# Patient Record
Sex: Male | Born: 1950 | Race: White | Hispanic: No | Marital: Married | Smoking: Current every day smoker
Health system: Southern US, Community
[De-identification: ages and names within clinical notes are randomized; demographics above are authoritative.]

## PROBLEM LIST (undated history)

## (undated) DIAGNOSIS — J449 Chronic obstructive pulmonary disease, unspecified: Secondary | ICD-10-CM

## (undated) DIAGNOSIS — G4700 Insomnia, unspecified: Secondary | ICD-10-CM

## (undated) DIAGNOSIS — K439 Ventral hernia without obstruction or gangrene: Secondary | ICD-10-CM

## (undated) DIAGNOSIS — E039 Hypothyroidism, unspecified: Secondary | ICD-10-CM

## (undated) DIAGNOSIS — E785 Hyperlipidemia, unspecified: Secondary | ICD-10-CM

## (undated) HISTORY — PX: HERNIA REPAIR: SHX51

## (undated) HISTORY — DX: Insomnia, unspecified: G47.00

## (undated) HISTORY — DX: Hypothyroidism, unspecified: E03.9

## (undated) HISTORY — DX: Hyperlipidemia, unspecified: E78.5

## (undated) HISTORY — PX: INGUINAL HERNIA REPAIR: SUR1180

## (undated) HISTORY — PX: SHOULDER SURGERY: SHX246

## (undated) HISTORY — PX: BACK SURGERY: SHX140

## (undated) HISTORY — DX: Ventral hernia without obstruction or gangrene: K43.9

## (undated) HISTORY — PX: APPENDECTOMY: SHX54

---

## 2004-07-15 ENCOUNTER — Emergency Department (HOSPITAL_COMMUNITY): Admission: EM | Admit: 2004-07-15 | Discharge: 2004-07-15 | Payer: Self-pay | Admitting: Emergency Medicine

## 2006-02-02 HISTORY — PX: CERVICAL DISC SURGERY: SHX588

## 2006-03-13 ENCOUNTER — Ambulatory Visit: Payer: Self-pay | Admitting: Orthopedic Surgery

## 2006-03-22 ENCOUNTER — Ambulatory Visit: Payer: Self-pay | Admitting: Orthopedic Surgery

## 2006-05-21 ENCOUNTER — Ambulatory Visit (HOSPITAL_COMMUNITY): Admission: RE | Admit: 2006-05-21 | Discharge: 2006-05-22 | Payer: Self-pay | Admitting: Neurosurgery

## 2007-12-20 ENCOUNTER — Ambulatory Visit: Payer: Self-pay | Admitting: Gastroenterology

## 2008-04-17 ENCOUNTER — Ambulatory Visit: Payer: Self-pay | Admitting: Family Medicine

## 2011-03-05 ENCOUNTER — Ambulatory Visit: Payer: Self-pay | Admitting: Surgery

## 2011-03-06 ENCOUNTER — Ambulatory Visit: Payer: Self-pay | Admitting: Surgery

## 2011-11-01 ENCOUNTER — Emergency Department: Payer: Self-pay | Admitting: Emergency Medicine

## 2013-03-03 ENCOUNTER — Ambulatory Visit: Payer: Self-pay | Admitting: Gastroenterology

## 2013-03-03 LAB — HM COLONOSCOPY

## 2013-03-07 LAB — PATHOLOGY REPORT

## 2013-06-23 ENCOUNTER — Ambulatory Visit: Payer: Self-pay | Admitting: Family Medicine

## 2013-09-30 ENCOUNTER — Observation Stay: Payer: Self-pay | Admitting: Internal Medicine

## 2013-09-30 LAB — CBC WITH DIFFERENTIAL/PLATELET
BASOS PCT: 0.6 %
Basophil #: 0.1 10*3/uL (ref 0.0–0.1)
EOS ABS: 0.3 10*3/uL (ref 0.0–0.7)
Eosinophil %: 3.4 %
HCT: 46.9 % (ref 40.0–52.0)
HGB: 15.4 g/dL (ref 13.0–18.0)
LYMPHS ABS: 2.5 10*3/uL (ref 1.0–3.6)
Lymphocyte %: 25.5 %
MCH: 31.4 pg (ref 26.0–34.0)
MCHC: 32.9 g/dL (ref 32.0–36.0)
MCV: 96 fL (ref 80–100)
Monocyte #: 0.6 x10 3/mm (ref 0.2–1.0)
Monocyte %: 6.3 %
NEUTROS ABS: 6.4 10*3/uL (ref 1.4–6.5)
NEUTROS PCT: 64.2 %
PLATELETS: 113 10*3/uL — AB (ref 150–440)
RBC: 4.9 10*6/uL (ref 4.40–5.90)
RDW: 14.1 % (ref 11.5–14.5)
WBC: 10 10*3/uL (ref 3.8–10.6)

## 2013-09-30 LAB — URINALYSIS, COMPLETE
BLOOD: NEGATIVE
Bacteria: NONE SEEN
Bilirubin,UR: NEGATIVE
Glucose,UR: NEGATIVE mg/dL (ref 0–75)
Hyaline Cast: 1
Ketone: NEGATIVE
Leukocyte Esterase: NEGATIVE
NITRITE: NEGATIVE
PH: 7 (ref 4.5–8.0)
Protein: NEGATIVE
RBC,UR: 1 /HPF (ref 0–5)
SPECIFIC GRAVITY: 1.008 (ref 1.003–1.030)
Squamous Epithelial: <1
WBC UR: 1 /HPF (ref 0–5)

## 2013-09-30 LAB — CK TOTAL AND CKMB (NOT AT ARMC)
CK, TOTAL: 69 U/L
CK, Total: 82 U/L
CK-MB: 1.1 ng/mL (ref 0.5–3.6)
CK-MB: 1.4 ng/mL (ref 0.5–3.6)

## 2013-09-30 LAB — COMPREHENSIVE METABOLIC PANEL
AST: 24 U/L (ref 15–37)
Albumin: 3.4 g/dL (ref 3.4–5.0)
Alkaline Phosphatase: 96 U/L
Anion Gap: 5 — ABNORMAL LOW (ref 7–16)
BILIRUBIN TOTAL: 0.3 mg/dL (ref 0.2–1.0)
BUN: 13 mg/dL (ref 7–18)
CALCIUM: 9 mg/dL (ref 8.5–10.1)
Chloride: 106 mmol/L (ref 98–107)
Co2: 27 mmol/L (ref 21–32)
Creatinine: 1.03 mg/dL (ref 0.60–1.30)
EGFR (African American): >60
GLUCOSE: 120 mg/dL — AB (ref 65–99)
OSMOLALITY: 277 (ref 275–301)
POTASSIUM: 3.7 mmol/L (ref 3.5–5.1)
SGPT (ALT): 20 U/L
Sodium: 138 mmol/L (ref 136–145)
Total Protein: 6.7 g/dL (ref 6.4–8.2)

## 2013-09-30 LAB — TROPONIN I
Troponin-I: <0.02 ng/mL
Troponin-I: <0.02 ng/mL
Troponin-I: <0.02 ng/mL

## 2013-09-30 LAB — TSH: THYROID STIMULATING HORM: 0.188 u[IU]/mL — AB

## 2014-01-16 LAB — BASIC METABOLIC PANEL
BUN: 11 mg/dL (ref 4–21)
Creatinine: 0.9 mg/dL (ref 0.6–1.3)
GLUCOSE: 114 mg/dL
Potassium: 4.4 mmol/L (ref 3.4–5.3)
Sodium: 142 mmol/L (ref 137–147)

## 2014-01-16 LAB — LIPID PANEL
Cholesterol: 153 mg/dL (ref 0–200)
HDL: 38 mg/dL (ref 35–70)
LDL Cholesterol: 94 mg/dL
Triglycerides: 103 mg/dL (ref 40–160)

## 2014-01-16 LAB — CBC AND DIFFERENTIAL
HCT: 45 % (ref 41–53)
Hemoglobin: 15 g/dL (ref 13.5–17.5)
NEUTROS ABS: 4 /uL
PLATELETS: 107 10*3/uL — AB (ref 150–399)
WBC: 6.4 10*3/mL

## 2014-01-16 LAB — HEPATIC FUNCTION PANEL
ALK PHOS: 93 U/L (ref 25–125)
ALT: 16 U/L (ref 10–40)
AST: 16 U/L (ref 14–40)
BILIRUBIN, TOTAL: 0.4 mg/dL

## 2014-03-21 LAB — PSA: PSA: 1.1

## 2014-03-21 LAB — TSH: TSH: 0.32 u[IU]/mL — AB (ref <=5.90)

## 2014-05-26 NOTE — Discharge Summary (Signed)
PATIENT NAME:  Hunter Oconnor, Hunter Oconnor MR#:  [id number] DATE OF BIRTH:  [birth year]  DATE OF ADMISSION:  09/30/2013  DATE OF DISCHARGE:  09/30/2013  DISCHARGE DIAGNOSES: 1.  Dizziness.  2.  Chronic obstructive pulmonary disease.  3.  Tobacco use.   DISCHARGE MEDICATIONS: 1.  Levothyroxine 200 mcg daily.  2.  Aciphex PCI 20 mg oral once a day. 3.  Pravastatin 40 mg daily.  4.  Aspirin 81 mg a day.  5.  Trazodone 100 mg daily.   DISCHARGE INSTRUCTIONS:  Low-fat, low-cholesterol diet. Activity as tolerated.  The patient has been counseled to quit smoking.   IMAGING STUDIES DONE:  Include a: 1.  Carotid ultrasound, which showed no significant stenosis.  2.  CT scan of the head without contrast shows no acute stroke bleed, mass.  3.  Chest x-ray, portable, showed no active disease.  4.  Echocardiogram showed EF of 50% to 55% with impaired relaxation pattern but no thrombus, no PFO, no significant valvular abnormalities.   ADMITTING HISTORY AND PHYSICAL: Please see detailed H and P dictated previously by Dr. Waldron Labs.  In brief, a 64 year old male patient with history of hypothyroidism and tobacco abuse, presented to the hospital complaining of acute onset of lightheadedness and he felt like he almost passed out, but did not black out. Did not have any chest pain, shortness of breath; did have some nausea. On arrival to the Emergency Room, his blood pressure was found to be in systolic of 50P with a heart rate in the 50s. The patient was admitted for further evaluation and treatment.   HOSPITAL COURSE: 1.  Presyncope. The patient was put on a tele floor, had three sets of cardiac enzymes done, which were normal. Had carotid ultrasound done, which showed no significant stenosis. Echo was normal. CT scan checked out fine.  Chest x-ray showed no acute disease. The patient improved well within an hour; was given some IV fluids. He was thought to have vasovagal episode or a stress-induced episode as he just  found out that his son had died. The patient being stable without any acute findings, will be discharged back home in a stable condition to follow with his primary care physician.   TIME SPENT: On day of discharge in discharge activity was 35 minutes.   PRIMARY CARE PROVIDER: Miguel Aschoff   ____________________________ Leia Alf Golden Gilreath, MD srs:lr D: 10/02/2013 13:11:38 ET T: 10/02/2013 13:46:44 ET JOB#: [id number]  cc: Alveta Heimlich R. Abbigail Anstey, MD, <Dictator> Richard L. Rosanna Randy, MD Neita Carp MD ELECTRONICALLY SIGNED 10/17/2013 16:30
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
## 2014-05-27 NOTE — Op Note (Signed)
PATIENT NAME:  Hunter Oconnor, Hunter Oconnor MR#:  [id number] DATE OF BIRTH:  04-04-1950  DATE OF PROCEDURE:  03/06/2011  PREOPERATIVE DIAGNOSIS: Recurrent right inguinal hernia.   POSTOPERATIVE DIAGNOSIS: Recurrent right inguinal hernia.   PROCEDURE: Laparoscopic right inguinal hernia repair.   SURGEON: Rodena Goldmann, MD   OPERATIVE PROCEDURE: With the patient in the supine position after the induction of appropriate general anesthesia, the patient's abdomen was prepped with ChloraPrep and draped with sterile towels. A Foley catheter had previously been placed. The patient was placed in the head down, feet up position. A small infraumbilical incision was made in the standard fashion, carried down bluntly through the subcutaneous tissue. Veress needle was used to cannulate the peritoneal cavity. CO2 was insufflated to appropriate pressure measurements. When approximately 2.5 liters of CO2 were instilled, the Veress needle was withdrawn. An 11 millimeter applied medical port was inserted into the peritoneal cavity. Intraperitoneal position was confirmed and CO2 was reinsufflated. The patient was placed in the feet up, head down position and two lateral ports 5 mm in size were placed under direct vision. There was a large direct defect from the previous repair. It sat medial to the epigastric vessels right on top of the femoral artery and femoral vein. The peritoneum was taken down laterally across the epigastric vessels to the lateral umbilical fold and dissected free from the floor of the inguinal canal and from the direct sac. The sac was reduced into the abdominal cavity, attenuated, transversalis fascia was allowed to fall back into the preperitoneal space. Atrium ProLite mesh was brought to the table and appropriately fashioned and inserted through the umbilical port. It was placed in the defect secured along Cooper's ligament and the aponeurotic arch. No tacks were placed inferiorly or laterally. Because of the  large defect, a second piece of mesh was placed over the a femoral vessels and again attached to Cooper's ligament and the anterior aponeurotic arch. The peritoneum was placed back over the mesh. The abdomen was desufflated. All ports were withdrawn without difficulty. Skin incisions were closed with 5-0 nylon. The areas were infiltrated with 0.25% Marcaine for postoperative pain control. Sterile dressings were applied.     The patient was returned to the recovery room having tolerated the procedure well. Sponge, instrument, and needle counts were correct x2 in the Operating Room. ____________________________ Micheline Maze, MD rle:rbg D: 03/06/2011 10:41:55 ET T: 03/06/2011 14:06:16 ET JOB#: [id number]  cc: Micheline Maze, MD, <Dictator> Richard L. Rosanna Randy, MD Rodena Goldmann MD ELECTRONICALLY SIGNED 03/15/2011 21:12

## 2014-07-10 DIAGNOSIS — E559 Vitamin D deficiency, unspecified: Secondary | ICD-10-CM | POA: Insufficient documentation

## 2014-07-10 DIAGNOSIS — L409 Psoriasis, unspecified: Secondary | ICD-10-CM | POA: Insufficient documentation

## 2014-07-10 DIAGNOSIS — K469 Unspecified abdominal hernia without obstruction or gangrene: Secondary | ICD-10-CM | POA: Insufficient documentation

## 2014-07-10 DIAGNOSIS — R739 Hyperglycemia, unspecified: Secondary | ICD-10-CM | POA: Insufficient documentation

## 2014-07-10 DIAGNOSIS — E7800 Pure hypercholesterolemia, unspecified: Secondary | ICD-10-CM | POA: Insufficient documentation

## 2014-07-10 DIAGNOSIS — R030 Elevated blood-pressure reading, without diagnosis of hypertension: Secondary | ICD-10-CM | POA: Insufficient documentation

## 2014-07-10 DIAGNOSIS — E785 Hyperlipidemia, unspecified: Secondary | ICD-10-CM | POA: Insufficient documentation

## 2014-07-10 DIAGNOSIS — F17200 Nicotine dependence, unspecified, uncomplicated: Secondary | ICD-10-CM | POA: Insufficient documentation

## 2014-07-10 DIAGNOSIS — G4700 Insomnia, unspecified: Secondary | ICD-10-CM | POA: Insufficient documentation

## 2014-07-10 DIAGNOSIS — K219 Gastro-esophageal reflux disease without esophagitis: Secondary | ICD-10-CM | POA: Insufficient documentation

## 2014-07-10 DIAGNOSIS — E039 Hypothyroidism, unspecified: Secondary | ICD-10-CM | POA: Insufficient documentation

## 2014-07-10 DIAGNOSIS — J449 Chronic obstructive pulmonary disease, unspecified: Secondary | ICD-10-CM | POA: Insufficient documentation

## 2014-07-10 DIAGNOSIS — M5412 Radiculopathy, cervical region: Secondary | ICD-10-CM | POA: Insufficient documentation

## 2014-07-10 HISTORY — DX: Chronic obstructive pulmonary disease, unspecified: J44.9

## 2014-09-19 ENCOUNTER — Ambulatory Visit (INDEPENDENT_AMBULATORY_CARE_PROVIDER_SITE_OTHER): Payer: 59 | Admitting: Family Medicine

## 2014-09-19 ENCOUNTER — Encounter: Payer: Self-pay | Admitting: Family Medicine

## 2014-09-19 VITALS — BP 130/64 | HR 78 | Temp 97.6°F | Resp 16 | Wt 150.0 lb

## 2014-09-19 DIAGNOSIS — R030 Elevated blood-pressure reading, without diagnosis of hypertension: Secondary | ICD-10-CM

## 2014-09-19 DIAGNOSIS — E039 Hypothyroidism, unspecified: Secondary | ICD-10-CM

## 2014-09-19 DIAGNOSIS — E7800 Pure hypercholesterolemia, unspecified: Secondary | ICD-10-CM

## 2014-09-19 NOTE — Progress Notes (Signed)
Patient ID: Hunter Oconnor, male   DOB: 04/17/1950, 64 y.o.   MRN: [id number]    Subjective:  HPI Pt is here for a 6 month follow on his hypothyroidism. He reports that he is feeling well. 03/21/14 he Levothyroxine was decreased to 125 mcg.  Last " routine" labs- 01/16/14   Prior to Admission medications   Medication Sig Start Date End Date Taking? Authorizing Provider  Ascorbic Acid 500 MG CAPS Take by mouth. 04/21/11  Yes Historical Provider, MD  aspirin 81 MG tablet Take by mouth. 04/21/11  Yes Historical Provider, MD  levothyroxine (SYNTHROID, LEVOTHROID) 125 MCG tablet Take by mouth. 03/26/14  Yes Historical Provider, MD  meloxicam (MOBIC) 7.5 MG tablet Take by mouth. 08/11/12  Yes Historical Provider, MD  Multiple Vitamin tablet Take by mouth. 04/21/11  Yes Historical Provider, MD  OMEGA-3 FATTY ACIDS PO Take by mouth. 04/21/11  Yes Historical Provider, MD  pravastatin (PRAVACHOL) 40 MG tablet Take by mouth. 06/19/14  Yes Historical Provider, MD  RABEprazole (ACIPHEX) 20 MG tablet Take by mouth. 06/19/14  Yes Historical Provider, MD  traZODone (DESYREL) 100 MG tablet Take by mouth. 06/19/14  Yes Historical Provider, MD  varenicline (CHANTIX) 1 MG tablet Take by mouth. 03/22/14  Yes Historical Provider, MD  cholecalciferol (VITAMIN D) 1000 UNITS tablet Take by mouth.    Historical Provider, MD    Patient Active Problem List   Diagnosis Date Noted  . Borderline hypertension 07/10/2014  . 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 CAFL (chronic airflow limitation) 07/10/2014  . Vitamin D deficiency 07/10/2014  . Hypercholesteremia 07/10/2014  . Acid reflux 07/10/2014  . Abdominal hernia 07/10/2014  . Blood glucose elevated 07/10/2014  . HLD (hyperlipidemia) 07/10/2014  . Acquired hypothyroidism 07/10/2014  . Cannot sleep 07/10/2014  . Psoriasis 07/10/2014  . Compulsive tobacco user syndrome 07/10/2014  . Avitaminosis D 07/10/2014    History reviewed. No pertinent past medical  history.  Social History   Social History  . Marital Status: Married    Spouse Name: N/A  . Number of Children: N/A  . Years of Education: N/A   Occupational History  . Not on file.   Social History Main Topics  . Smoking status: Current Some Day Smoker  . Smokeless tobacco: Not on file     Comment: Pt reportst that he does not smoke every day. He may go 2-3 weeks without smoking.  . Alcohol Use: Yes     Comment: 1-2 drinks on the weekends  . Drug Use: No  . Sexual Activity: Not on file   Other Topics Concern  . Not on file   Social History Narrative    Not on File  Review of Systems  Constitutional: Negative.   HENT: Negative.   Respiratory: Negative.   Cardiovascular: Negative.   Musculoskeletal: Negative.   Endo/Heme/Allergies: Negative.   Psychiatric/Behavioral: Negative.   All other systems reviewed and are negative.   Immunization History  Administered Date(s) Administered  . Pneumococcal Conjugate-13 03/21/2014  . Tdap 11/16/2011   Objective:  BP 130/64 mmHg  Pulse 78  Temp(Src) 97.6 F (36.4 C) (Oral)  Resp 16  Wt 150 lb (68.04 kg)  Physical Exam  Constitutional: He is oriented to person, place, and time and well-developed, well-nourished, and in no distress.  HENT:  Head: Normocephalic and atraumatic.  Right Ear: External ear normal.  Left Ear: External ear normal.  Nose: Nose normal.  Eyes: Conjunctivae are normal.  Neck: Neck supple.  Cardiovascular: Normal  rate, regular rhythm and normal heart sounds.   Pulmonary/Chest: Effort normal and breath sounds normal.  Abdominal: Soft.  Neurological: He is alert and oriented to person, place, and time. Gait normal.  Skin: Skin is warm and dry.  Psychiatric: Mood, memory, affect and judgment normal.    Lab Results  Component Value Date   WBC 6.4 01/16/2014   HGB 15.0 01/16/2014   HCT 45 01/16/2014   PLT 107* 01/16/2014   GLUCOSE 120* 09/30/2013   CHOL 153 01/16/2014   TRIG 103  01/16/2014   HDL 38 01/16/2014   LDLCALC 94 01/16/2014   TSH 0.32* 03/21/2014   PSA 1.1 03/21/2014    CMP     Component Value Date/Time   NA 142 01/16/2014   NA 138 09/30/2013 0155   K 4.4 01/16/2014   K 3.7 09/30/2013 0155   CL 106 09/30/2013 0155   CO2 27 09/30/2013 0155   GLUCOSE 120* 09/30/2013 0155   BUN 11 01/16/2014   BUN 13 09/30/2013 0155   CREATININE 0.9 01/16/2014   CREATININE 1.03 09/30/2013 0155   CALCIUM 9.0 09/30/2013 0155   PROT 6.7 09/30/2013 0155   ALBUMIN 3.4 09/30/2013 0155   AST 16 01/16/2014   AST 24 09/30/2013 0155   ALT 16 01/16/2014   ALT 20 09/30/2013 0155   ALKPHOS 93 01/16/2014   ALKPHOS 96 09/30/2013 0155   BILITOT 0.3 09/30/2013 0155   GFRNONAA >60 09/30/2013 0155   GFRAA >60 09/30/2013 0155    Assessment and Plan :  Hypothyroidism Tobacco abuse I praised the patient as he has smoked 2 cigarettes in the past 3 weeks. I told him that he has essentially quit and just follow through with this. I think a 11-year-old granddaughter has inspiratory him to do this. Miguel Aschoff MD [facility] 09/19/2014 4:35 PM

## 2014-11-09 LAB — TSH: TSH: 4.43 u[IU]/mL (ref 0.450–4.500)

## 2015-02-12 LAB — LIPID PANEL
Cholesterol: 152 mg/dL (ref 0–200)
HDL: 38 mg/dL (ref 35–70)
LDL CALC: 91 mg/dL
TRIGLYCERIDES: 112 mg/dL (ref 40–160)

## 2015-02-12 LAB — BASIC METABOLIC PANEL: GLUCOSE: 110 mg/dL

## 2015-03-26 ENCOUNTER — Ambulatory Visit (INDEPENDENT_AMBULATORY_CARE_PROVIDER_SITE_OTHER): Payer: 59 | Admitting: Family Medicine

## 2015-03-26 VITALS — BP 136/74 | HR 64 | Temp 97.7°F | Resp 16 | Ht 68.0 in | Wt 154.0 lb

## 2015-03-26 DIAGNOSIS — Z720 Tobacco use: Secondary | ICD-10-CM

## 2015-03-26 DIAGNOSIS — Z1211 Encounter for screening for malignant neoplasm of colon: Secondary | ICD-10-CM | POA: Diagnosis not present

## 2015-03-26 DIAGNOSIS — Z0000 Encounter for general adult medical examination without abnormal findings: Secondary | ICD-10-CM

## 2015-03-26 DIAGNOSIS — Z23 Encounter for immunization: Secondary | ICD-10-CM

## 2015-03-26 DIAGNOSIS — Z125 Encounter for screening for malignant neoplasm of prostate: Secondary | ICD-10-CM

## 2015-03-26 LAB — POCT URINALYSIS DIPSTICK
Bilirubin, UA: NEGATIVE
Glucose, UA: NEGATIVE
KETONES UA: NEGATIVE
Leukocytes, UA: NEGATIVE
Nitrite, UA: NEGATIVE
PH UA: 6.5
PROTEIN UA: NEGATIVE
RBC UA: NEGATIVE
UROBILINOGEN UA: NEGATIVE

## 2015-03-26 LAB — IFOBT (OCCULT BLOOD): IMMUNOLOGICAL FECAL OCCULT BLOOD TEST: NEGATIVE

## 2015-03-26 MED ORDER — VARENICLINE TARTRATE 0.5 MG X 11 & 1 MG X 42 PO MISC: ORAL | Status: DC

## 2015-03-26 NOTE — Progress Notes (Signed)
Patient ID: Hunter Oconnor, male   DOB: July 22, 1950, 65 y.o.   MRN: [id number] Patient: Hunter Oconnor, Male    DOB: 1950/07/13, 65 y.o.   MRN: [id number] Visit Date: 03/26/2015  Today's Provider: Wilhemena Durie, MD   Chief Complaint  Patient presents with  . Annual Exam   Subjective:  Hunter Oconnor is a 65 y.o. male who presents today for health maintenance and complete physical. He feels well. He reports exercising none. He reports he is sleeping well.   Review of Systems  Constitutional: Negative.   HENT: Negative.   Eyes: Negative.   Respiratory: Negative.   Cardiovascular: Negative.   Gastrointestinal: Negative.   Endocrine: Negative.   Genitourinary: Negative.   Musculoskeletal: Negative.   Skin: Negative.   Allergic/Immunologic: Negative.   Neurological: Negative.   Hematological: Negative.   Psychiatric/Behavioral: Negative.     Social History   Social History  . Marital Status: Married    Spouse Name: N/A  . Number of Children: N/A  . Years of Education: N/A   Occupational History  . Not on file.   Social History Main Topics  . Smoking status: Current Some Day Smoker  . Smokeless tobacco: Not on file     Comment: Pt reportst that he does not smoke every day. He may go 2-3 weeks without smoking.  . Alcohol Use: Yes     Comment: 1-2 drinks on the weekends  . Drug Use: No  . Sexual Activity: Not on file   Other Topics Concern  . Not on file   Social History Narrative   Fall Risk  03/26/2015  Falls in the past year? No  Functional Status Survey: Is the patient deaf or have difficulty hearing?: No Does the patient have difficulty seeing, even when wearing glasses/contacts?: No Does the patient have difficulty concentrating, remembering, or making decisions?: No Does the patient have difficulty walking or climbing stairs?: No Does the patient have difficulty dressing or bathing?: No Does the patient have difficulty doing errands alone such as  visiting a doctor's office or shopping?: No   Patient Active Problem List   Diagnosis Date Noted  . Borderline hypertension 07/10/2014  . 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 CAFL (chronic airflow limitation) ([place]) 07/10/2014  . Vitamin D deficiency 07/10/2014  . Hypercholesteremia 07/10/2014  . Acid reflux 07/10/2014  . Abdominal hernia 07/10/2014  . Blood glucose elevated 07/10/2014  . HLD (hyperlipidemia) 07/10/2014  . Acquired hypothyroidism 07/10/2014  . Cannot sleep 07/10/2014  . Psoriasis 07/10/2014  . Compulsive tobacco user syndrome 07/10/2014  . Avitaminosis D 07/10/2014    Past Surgical History  Procedure Laterality Date  . Cervical disc surgery  2008    Ruptured disc  . Back surgery      lumbar  . Appendectomy    . Shoulder surgery    . Hernia repair    . Inguinal hernia repair Right     His family history includes Cancer in his father and mother; Hypertension in his father.    Outpatient Prescriptions Prior to Visit  Medication Sig Dispense Refill  . aspirin 81 MG tablet Take by mouth.    . levothyroxine (SYNTHROID, LEVOTHROID) 125 MCG tablet Take by mouth.    . pravastatin (PRAVACHOL) 40 MG tablet Take by mouth.    . RABEprazole (ACIPHEX) 20 MG tablet Take by mouth.    . traZODone (DESYREL) 100 MG tablet Take by mouth.    . Ascorbic Acid 500 MG CAPS  Take by mouth.    . cholecalciferol (VITAMIN D) 1000 UNITS tablet Take by mouth.    . meloxicam (MOBIC) 7.5 MG tablet Take by mouth.    . Multiple Vitamin tablet Take by mouth.    . OMEGA-3 FATTY ACIDS PO Take by mouth.    . varenicline (CHANTIX) 1 MG tablet Take by mouth.     No facility-administered medications prior to visit.   Outpatient Encounter Prescriptions as of 03/26/2015  Medication Sig Note  . aspirin 81 MG tablet Take by mouth. 07/10/2014: Received from: [facility]  . levothyroxine (SYNTHROID, LEVOTHROID) 125 MCG tablet Take by mouth. 07/10/2014: PLEASE DISREGARD RX SENT  IN AT 150 MCG Received from: [facility]  . pravastatin (PRAVACHOL) 40 MG tablet Take by mouth. 07/10/2014: Received from: [facility]  . RABEprazole (ACIPHEX) 20 MG tablet Take by mouth. 07/10/2014: Received from: [facility]  . traZODone (DESYREL) 100 MG tablet Take by mouth. 07/10/2014: Received from: [facility]  . [DISCONTINUED] Ascorbic Acid 500 MG CAPS Take by mouth. 07/10/2014: Received from: [facility]  . [DISCONTINUED] cholecalciferol (VITAMIN D) 1000 UNITS tablet Take by mouth. 07/10/2014: Received from: [facility]  . [DISCONTINUED] meloxicam (MOBIC) 7.5 MG tablet Take by mouth. 07/10/2014: Received from: [facility]  . [DISCONTINUED] Multiple Vitamin tablet Take by mouth. 07/10/2014: Received from: [facility]  . [DISCONTINUED] OMEGA-3 FATTY ACIDS PO Take by mouth. 07/10/2014: Received from: [facility]  . [DISCONTINUED] varenicline (CHANTIX) 1 MG tablet Take by mouth. 07/10/2014: Received from: [facility]   No facility-administered encounter medications on file as of 03/26/2015.    Patient Care Team: Jerrol Banana., MD as PCP - General (Family Medicine)     Objective:   Vitals:  Filed Vitals:   03/26/15 1412  BP: 136/74  Pulse: 64  Temp: 97.7 F (36.5 C)  TempSrc: Oral  Resp: 16  Height: 5\' 8"  (1.727 m)  Weight: 154 lb (69.854 kg)    Physical Exam  Constitutional: He is oriented to person, place, and time. He appears well-developed and well-nourished.  HENT:  Head: Normocephalic and atraumatic.  Right Ear: External ear normal.  Left Ear: External ear normal.  Nose: Nose normal.  Mouth/Throat: Oropharynx is clear and moist.  Eyes: Conjunctivae and EOM are normal. Pupils are equal, round, and reactive to light.  Neck: Normal range of motion. Neck supple.  Cardiovascular: Normal rate, regular  rhythm, normal heart sounds and intact distal pulses.   Pulmonary/Chest: Effort normal and breath sounds normal.  Abdominal: Soft. Bowel sounds are normal.  Genitourinary: Rectum normal, prostate normal and penis normal.  Musculoskeletal: Normal range of motion.  Neurological: He is alert and oriented to person, place, and time.  Skin: Skin is warm and dry.  Psychiatric: He has a normal mood and affect. His behavior is normal. Judgment and thought content normal.     Depression Screen No flowsheet data found.    Assessment & Plan:     Routine Health Maintenance and Physical Exam  Exercise Activities and Dietary recommendations Goals    None      Immunization History  Administered Date(s) Administered  . Influenza-Unspecified 12/28/2014  . Pneumococcal Conjugate-13 03/21/2014  . Tdap 11/16/2011    Health Maintenance  Topic Date Due  . Hepatitis C Screening  03-24-1950  . ZOSTAVAX  11/22/2008  . PNA vac Low Risk Adult (2 of 2 - PPSV23) 03/22/2015  . INFLUENZA VACCINE  09/03/2015  . TETANUS/TDAP  11/15/2021  . COLONOSCOPY  03/04/2023      Discussed health benefits of physical activity, and encouraged him to engage in regular exercise appropriate for his age and condition.   tobacco abuse Patient wishes to quit. Chantix started. I have done the exam and reviewed the above chart and it is accurate to the best of Hunter knowledge.  ------------------------------------------------------------------------------------------------------------

## 2015-04-10 LAB — COMPREHENSIVE METABOLIC PANEL
A/G RATIO: 1.6 (ref 1.1–2.5)
ALBUMIN: 4.2 g/dL (ref 3.6–4.8)
ALT: 17 IU/L (ref 0–44)
AST: 16 IU/L (ref 0–40)
Alkaline Phosphatase: 81 IU/L (ref 39–117)
BUN / CREAT RATIO: 16 (ref 10–22)
BUN: 18 mg/dL (ref 8–27)
Bilirubin Total: 0.4 mg/dL (ref 0.0–1.2)
CALCIUM: 9.6 mg/dL (ref 8.6–10.2)
CO2: 26 mmol/L (ref 18–29)
Chloride: 100 mmol/L (ref 96–106)
Creatinine, Ser: 1.15 mg/dL (ref 0.76–1.27)
GFR, EST AFRICAN AMERICAN: 76 mL/min/{1.73_m2} (ref >59)
GFR, EST NON AFRICAN AMERICAN: 66 mL/min/{1.73_m2} (ref >59)
GLOBULIN, TOTAL: 2.6 g/dL (ref 1.5–4.5)
Glucose: 111 mg/dL — ABNORMAL HIGH (ref 65–99)
POTASSIUM: 4.6 mmol/L (ref 3.5–5.2)
SODIUM: 141 mmol/L (ref 134–144)
TOTAL PROTEIN: 6.8 g/dL (ref 6.0–8.5)

## 2015-04-10 LAB — CBC WITH DIFFERENTIAL/PLATELET
Basophils Absolute: 0 10*3/uL (ref 0.0–0.2)
Basos: 0 %
EOS (ABSOLUTE): 0.2 10*3/uL (ref 0.0–0.4)
EOS: 3 %
HEMATOCRIT: 45.8 % (ref 37.5–51.0)
Hemoglobin: 15.7 g/dL (ref 12.6–17.7)
IMMATURE GRANS (ABS): 0 10*3/uL (ref 0.0–0.1)
IMMATURE GRANULOCYTES: 0 %
Lymphocytes Absolute: 1.6 10*3/uL (ref 0.7–3.1)
Lymphs: 21 %
MCH: 32.8 pg (ref 26.6–33.0)
MCHC: 34.3 g/dL (ref 31.5–35.7)
MCV: 96 fL (ref 79–97)
Monocytes Absolute: 0.4 10*3/uL (ref 0.1–0.9)
Monocytes: 6 %
NEUTROS ABS: 5.4 10*3/uL (ref 1.4–7.0)
NEUTROS PCT: 70 %
Platelets: 112 10*3/uL — ABNORMAL LOW (ref 150–379)
RBC: 4.79 x10E6/uL (ref 4.14–5.80)
RDW: 14.7 % (ref 12.3–15.4)
WBC: 7.7 10*3/uL (ref 3.4–10.8)

## 2015-04-10 LAB — LIPID PANEL WITH LDL/HDL RATIO
Cholesterol, Total: 180 mg/dL (ref 100–199)
HDL: 45 mg/dL (ref >39)
LDL CALC: 118 mg/dL — AB (ref 0–99)
LDL/HDL RATIO: 2.6 ratio (ref 0.0–3.6)
TRIGLYCERIDES: 85 mg/dL (ref 0–149)
VLDL Cholesterol Cal: 17 mg/dL (ref 5–40)

## 2015-04-10 LAB — PSA: Prostate Specific Ag, Serum: 1.1 ng/mL (ref 0.0–4.0)

## 2015-04-10 LAB — TSH: TSH: 6.67 u[IU]/mL — AB (ref 0.450–4.500)

## 2015-04-16 ENCOUNTER — Telehealth: Payer: Self-pay

## 2015-04-16 MED ORDER — LEVOTHYROXINE SODIUM 150 MCG PO TABS: 150.0000 ug | ORAL_TABLET | Freq: Every day | ORAL | Status: DC

## 2015-04-16 NOTE — Telephone Encounter (Signed)
rx sent in-[facility] 

## 2015-06-21 ENCOUNTER — Other Ambulatory Visit: Payer: Self-pay | Admitting: Family Medicine

## 2015-06-22 MED ORDER — VARENICLINE TARTRATE 1 MG PO TABS: 1.0000 mg | ORAL_TABLET | Freq: Two times a day (BID) | ORAL | Status: AC

## 2015-06-24 ENCOUNTER — Other Ambulatory Visit: Payer: Self-pay

## 2015-06-24 MED ORDER — RABEPRAZOLE SODIUM 20 MG PO TBEC: 20.0000 mg | DELAYED_RELEASE_TABLET | Freq: Every day | ORAL | Status: DC

## 2015-06-24 MED ORDER — PRAVASTATIN SODIUM 40 MG PO TABS: 40.0000 mg | ORAL_TABLET | Freq: Every day | ORAL | Status: DC

## 2015-06-24 MED ORDER — TRAZODONE HCL 100 MG PO TABS: 100.0000 mg | ORAL_TABLET | Freq: Every day | ORAL | Status: DC

## 2015-06-25 LAB — HEMOGLOBIN A1C: Hemoglobin A1C: 6.1

## 2015-09-24 ENCOUNTER — Ambulatory Visit: Payer: 59 | Admitting: Family Medicine

## 2016-03-30 ENCOUNTER — Other Ambulatory Visit: Payer: Self-pay

## 2016-03-31 ENCOUNTER — Other Ambulatory Visit: Payer: Self-pay

## 2016-03-31 MED ORDER — TRAZODONE HCL 100 MG PO TABS: 100.0000 mg | ORAL_TABLET | Freq: Every day | ORAL | 3 refills | Status: DC

## 2016-03-31 MED ORDER — RABEPRAZOLE SODIUM 20 MG PO TBEC: 20.0000 mg | DELAYED_RELEASE_TABLET | Freq: Every day | ORAL | 3 refills | Status: DC

## 2016-03-31 MED ORDER — PRAVASTATIN SODIUM 40 MG PO TABS: 40.0000 mg | ORAL_TABLET | Freq: Every day | ORAL | 3 refills | Status: DC

## 2016-03-31 MED ORDER — LEVOTHYROXINE SODIUM 150 MCG PO TABS: 150.0000 ug | ORAL_TABLET | Freq: Every day | ORAL | 3 refills | Status: DC

## 2016-04-09 ENCOUNTER — Ambulatory Visit (INDEPENDENT_AMBULATORY_CARE_PROVIDER_SITE_OTHER): Payer: 59 | Admitting: Family Medicine

## 2016-04-09 ENCOUNTER — Encounter: Payer: Self-pay | Admitting: Family Medicine

## 2016-04-09 VITALS — BP 142/80 | HR 70 | Temp 98.1°F | Resp 16 | Wt 144.0 lb

## 2016-04-09 DIAGNOSIS — K219 Gastro-esophageal reflux disease without esophagitis: Secondary | ICD-10-CM

## 2016-04-09 DIAGNOSIS — E039 Hypothyroidism, unspecified: Secondary | ICD-10-CM | POA: Diagnosis not present

## 2016-04-09 DIAGNOSIS — R739 Hyperglycemia, unspecified: Secondary | ICD-10-CM

## 2016-04-09 DIAGNOSIS — E7800 Pure hypercholesterolemia, unspecified: Secondary | ICD-10-CM | POA: Diagnosis not present

## 2016-04-09 DIAGNOSIS — Z720 Tobacco use: Secondary | ICD-10-CM

## 2016-04-09 MED ORDER — RABEPRAZOLE SODIUM 20 MG PO TBEC: 20.0000 mg | DELAYED_RELEASE_TABLET | Freq: Every day | ORAL | 3 refills | Status: DC

## 2016-04-09 MED ORDER — RABEPRAZOLE SODIUM 20 MG PO TBEC: 20.0000 mg | DELAYED_RELEASE_TABLET | Freq: Every day | ORAL | 0 refills | Status: DC

## 2016-04-09 MED ORDER — NICOTINE 14 MG/24HR TD PT24
14.0000 mg | MEDICATED_PATCH | Freq: Every day | TRANSDERMAL | 4 refills | Status: DC
Start: 2016-04-09 — End: 2019-10-12

## 2016-04-09 NOTE — Progress Notes (Signed)
Subjective:  HPI Pt is here for a follow up of his chronic problems. He is also due for labs. He is also needing a refill on aciphex. Pt reports he is feeling well other than seasonal allergies. Denies chest pain, SOB, or dizziness.  He is now ready to quit smoking. Overall feels well. Prior to Admission medications   Medication Sig Start Date End Date Taking? Authorizing Provider  aspirin 81 MG tablet Take by mouth. 04/21/11   Historical Provider, MD  levothyroxine (SYNTHROID, LEVOTHROID) 150 MCG tablet Take 1 tablet (150 mcg total) by mouth daily. 03/31/16   Richard Maceo Pro., MD  pravastatin (PRAVACHOL) 40 MG tablet Take 1 tablet (40 mg total) by mouth daily. 03/31/16   Richard Maceo Pro., MD  RABEprazole (ACIPHEX) 20 MG tablet Take 1 tablet (20 mg total) by mouth daily. 03/31/16   Richard Maceo Pro., MD  traZODone (DESYREL) 100 MG tablet Take 1 tablet (100 mg total) by mouth at bedtime. 03/31/16   Jerrol Banana., MD    Patient Active Problem List   Diagnosis Date Noted  . Borderline hypertension 07/10/2014  . 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 CAFL (chronic airflow limitation) ([place]) 07/10/2014  . Vitamin D deficiency 07/10/2014  . Hypercholesteremia 07/10/2014  . Acid reflux 07/10/2014  . Abdominal hernia 07/10/2014  . Blood glucose elevated 07/10/2014  . HLD (hyperlipidemia) 07/10/2014  . Acquired hypothyroidism 07/10/2014  . Cannot sleep 07/10/2014  . Psoriasis 07/10/2014  . Compulsive tobacco user syndrome 07/10/2014  . Avitaminosis D 07/10/2014    History reviewed. No pertinent past medical history.  Social History   Social History  . Marital status: Married    Spouse name: N/A  . Number of children: N/A  . Years of education: N/A   Occupational History  . Not on file.   Social History Main Topics  . Smoking status: Current Every Day Smoker  . Smokeless tobacco: Never Used     Comment: Pt reportst that he does not smoke every day. He  may go 2-3 weeks without smoking.  . Alcohol use Yes     Comment: 1-2 drinks on the weekends  . Drug use: No  . Sexual activity: Not on file   Other Topics Concern  . Not on file   Social History Narrative  . No narrative on file    Not on File  Review of Systems  Constitutional: Negative.   HENT: Positive for congestion.        Allergies   Eyes: Negative.   Respiratory: Positive for cough.   Cardiovascular: Negative.   Gastrointestinal: Negative.   Genitourinary: Negative.   Musculoskeletal: Negative.   Skin: Negative.   Endo/Heme/Allergies: Positive for environmental allergies.  Psychiatric/Behavioral: Negative.     Immunization History  Administered Date(s) Administered  . Influenza-Unspecified 12/28/2014  . Pneumococcal Conjugate-13 03/21/2014  . Pneumococcal Polysaccharide-23 03/26/2015  . Tdap 11/16/2011    Objective:  BP (!) 142/80 (BP Location: Left Arm, Patient Position: Sitting, Cuff Size: Normal)   Pulse 70   Temp 98.1 F (36.7 C) (Oral)   Resp 16   Wt 144 lb (65.3 kg)   BMI 21.90 kg/m   Physical Exam  Constitutional: He is oriented to person, place, and time and well-developed, well-nourished, and in no distress.  HENT:  Head: Normocephalic and atraumatic.  Right Ear: External ear normal.  Left Ear: External ear normal.  Nose: Nose normal.  Mouth/Throat: Oropharynx is clear and moist.  Eyes: Conjunctivae and EOM are normal. Pupils are equal, round, and reactive to light.  Neck: Normal range of motion. Neck supple. No thyromegaly present.  Cardiovascular: Normal rate, regular rhythm, normal heart sounds and intact distal pulses.   Pulmonary/Chest: Effort normal.  expiratory rhonchi bilaterally   Abdominal: Soft.  Musculoskeletal: Normal range of motion.  Lymphadenopathy:    He has no cervical adenopathy.  Neurological: He is alert and oriented to person, place, and time. He has normal reflexes. Gait normal. GCS score is 15.  Skin: Skin is  warm and dry.  Psychiatric: Mood, memory, affect and judgment normal.    Lab Results  Component Value Date   WBC 7.7 04/09/2015   HGB 15.0 01/16/2014   HCT 45.8 04/09/2015   PLT 112 (L) 04/09/2015   GLUCOSE 111 (H) 04/09/2015   CHOL 180 04/09/2015   TRIG 85 04/09/2015   HDL 45 04/09/2015   LDLCALC 118 (H) 04/09/2015   TSH 6.670 (H) 04/09/2015   PSA 1.1 03/21/2014   HGBA1C 6.1 06/25/2015    CMP     Component Value Date/Time   NA 141 04/09/2015 0806   NA 138 09/30/2013 0155   K 4.6 04/09/2015 0806   K 3.7 09/30/2013 0155   CL 100 04/09/2015 0806   CL 106 09/30/2013 0155   CO2 26 04/09/2015 0806   CO2 27 09/30/2013 0155   GLUCOSE 111 (H) 04/09/2015 0806   GLUCOSE 120 (H) 09/30/2013 0155   BUN 18 04/09/2015 0806   BUN 13 09/30/2013 0155   CREATININE 1.15 04/09/2015 0806   CREATININE 1.03 09/30/2013 0155   CALCIUM 9.6 04/09/2015 0806   CALCIUM 9.0 09/30/2013 0155   PROT 6.8 04/09/2015 0806   PROT 6.7 09/30/2013 0155   ALBUMIN 4.2 04/09/2015 0806   ALBUMIN 3.4 09/30/2013 0155   AST 16 04/09/2015 0806   AST 24 09/30/2013 0155   ALT 17 04/09/2015 0806   ALT 20 09/30/2013 0155   ALKPHOS 81 04/09/2015 0806   ALKPHOS 96 09/30/2013 0155   BILITOT 0.4 04/09/2015 0806   BILITOT 0.3 09/30/2013 0155   GFRNONAA 66 04/09/2015 0806   GFRNONAA >60 09/30/2013 0155   GFRAA 76 04/09/2015 0806   GFRAA >60 09/30/2013 0155    Assessment and Plan :  1. Gastroesophageal reflux disease, esophagitis presence not specified  - CBC with Differential/Platelet - RABEprazole (ACIPHEX) 20 MG tablet; Take 1 tablet (20 mg total) by mouth daily.  Dispense: 90 tablet; Refill: 3  2. Acquired hypothyroidism  - TSH  3. Hypercholesteremia  - Lipid Panel With LDL/HDL Ratio - Comprehensive metabolic panel  4. Blood glucose elevated  - Hemoglobin A1c  5. Tobacco abuse Pt now willing to try to quit. - nicotine (NICODERM CQ - DOSED IN MG/24 HOURS) 14 mg/24hr patch; Place 1 patch (14 mg  total) onto the skin daily.  Dispense: 28 patch; Refill: 4.   HPI, Exam, and A&P Transcribed under the direction and in the presence of Richard L. Cranford Mon, MD  Electronically Signed: Katina Dung, CMA I have done the exam and reviewed the above chart and it is accurate to the best of my knowledge. Development worker, community has been used in this note in any air is in the dictation or transcription are unintentional.  [facility] 04/09/2016 4:11 PM

## 2016-04-25 LAB — LIPID PANEL WITH LDL/HDL RATIO
CHOLESTEROL TOTAL: 197 mg/dL (ref 100–199)
HDL: 47 mg/dL (ref >39)
LDL CALC: 131 mg/dL — AB (ref 0–99)
LDl/HDL Ratio: 2.8 ratio units (ref 0.0–3.6)
Triglycerides: 95 mg/dL (ref 0–149)
VLDL CHOLESTEROL CAL: 19 mg/dL (ref 5–40)

## 2016-04-25 LAB — COMPREHENSIVE METABOLIC PANEL
A/G RATIO: 1.8 (ref 1.2–2.2)
ALBUMIN: 4.2 g/dL (ref 3.6–4.8)
ALT: 16 IU/L (ref 0–44)
AST: 13 IU/L (ref 0–40)
Alkaline Phosphatase: 77 IU/L (ref 39–117)
BUN / CREAT RATIO: 12 (ref 10–24)
BUN: 14 mg/dL (ref 8–27)
Bilirubin Total: 0.3 mg/dL (ref 0.0–1.2)
CALCIUM: 9.8 mg/dL (ref 8.6–10.2)
CHLORIDE: 99 mmol/L (ref 96–106)
CO2: 28 mmol/L (ref 18–29)
Creatinine, Ser: 1.15 mg/dL (ref 0.76–1.27)
GFR, EST AFRICAN AMERICAN: 76 mL/min/{1.73_m2} (ref >59)
GFR, EST NON AFRICAN AMERICAN: 65 mL/min/{1.73_m2} (ref >59)
GLOBULIN, TOTAL: 2.4 g/dL (ref 1.5–4.5)
Glucose: 98 mg/dL (ref 65–99)
POTASSIUM: 5 mmol/L (ref 3.5–5.2)
Sodium: 139 mmol/L (ref 134–144)
TOTAL PROTEIN: 6.6 g/dL (ref 6.0–8.5)

## 2016-04-25 LAB — CBC WITH DIFFERENTIAL/PLATELET
BASOS ABS: 0 10*3/uL (ref 0.0–0.2)
Basos: 1 %
EOS (ABSOLUTE): 0.3 10*3/uL (ref 0.0–0.4)
Eos: 4 %
HEMOGLOBIN: 15.5 g/dL (ref 13.0–17.7)
Hematocrit: 46.1 % (ref 37.5–51.0)
IMMATURE GRANS (ABS): 0 10*3/uL (ref 0.0–0.1)
IMMATURE GRANULOCYTES: 0 %
Lymphocytes Absolute: 1.8 10*3/uL (ref 0.7–3.1)
Lymphs: 27 %
MCH: 32 pg (ref 26.6–33.0)
MCHC: 33.6 g/dL (ref 31.5–35.7)
MCV: 95 fL (ref 79–97)
MONOCYTES: 7 %
Monocytes Absolute: 0.4 10*3/uL (ref 0.1–0.9)
NEUTROS PCT: 61 %
Neutrophils Absolute: 4.1 10*3/uL (ref 1.4–7.0)
Platelets: 106 10*3/uL — ABNORMAL LOW (ref 150–379)
RBC: 4.85 x10E6/uL (ref 4.14–5.80)
RDW: 14.6 % (ref 12.3–15.4)
WBC: 6.6 10*3/uL (ref 3.4–10.8)

## 2016-04-25 LAB — HEMOGLOBIN A1C
ESTIMATED AVERAGE GLUCOSE: 126 mg/dL
Hgb A1c MFr Bld: 6 % — ABNORMAL HIGH (ref 4.8–5.6)

## 2016-04-25 LAB — TSH: TSH: 4.81 u[IU]/mL — ABNORMAL HIGH (ref 0.450–4.500)

## 2016-04-27 ENCOUNTER — Telehealth: Payer: Self-pay | Admitting: Emergency Medicine

## 2016-04-27 DIAGNOSIS — K219 Gastro-esophageal reflux disease without esophagitis: Secondary | ICD-10-CM

## 2016-04-27 NOTE — Telephone Encounter (Signed)
-----   Message from Jerrol Banana., MD sent at 04/27/2016 11:08 AM EDT ----- Labs stable.

## 2016-04-29 ENCOUNTER — Telehealth: Payer: Self-pay | Admitting: Family Medicine

## 2016-04-29 MED ORDER — RABEPRAZOLE SODIUM 20 MG PO TBEC: 20.0000 mg | DELAYED_RELEASE_TABLET | Freq: Every day | ORAL | 3 refills | Status: DC

## 2016-04-29 MED ORDER — PRAVASTATIN SODIUM 40 MG PO TABS: 40.0000 mg | ORAL_TABLET | Freq: Every day | ORAL | 3 refills | Status: DC

## 2016-04-29 MED ORDER — LEVOTHYROXINE SODIUM 150 MCG PO TABS: 150.0000 ug | ORAL_TABLET | Freq: Every day | ORAL | 3 refills | Status: DC

## 2016-04-29 MED ORDER — TRAZODONE HCL 100 MG PO TABS: 100.0000 mg | ORAL_TABLET | Freq: Every day | ORAL | 3 refills | Status: DC

## 2016-04-29 NOTE — Telephone Encounter (Signed)
Pt informed

## 2016-09-16 ENCOUNTER — Encounter: Payer: 59 | Admitting: Family Medicine

## 2016-10-12 ENCOUNTER — Encounter: Payer: Self-pay | Admitting: Family Medicine

## 2016-10-22 ENCOUNTER — Encounter: Payer: Self-pay | Admitting: Family Medicine

## 2017-04-01 ENCOUNTER — Other Ambulatory Visit: Payer: Self-pay | Admitting: Family Medicine

## 2017-07-27 ENCOUNTER — Encounter: Payer: Self-pay | Admitting: Family Medicine

## 2017-10-19 ENCOUNTER — Ambulatory Visit (INDEPENDENT_AMBULATORY_CARE_PROVIDER_SITE_OTHER): Payer: Medicare Other | Admitting: Family Medicine

## 2017-10-19 ENCOUNTER — Encounter: Payer: Self-pay | Admitting: Family Medicine

## 2017-10-19 VITALS — BP 132/62 | HR 56 | Temp 97.9°F | Resp 16 | Ht 68.0 in | Wt 138.0 lb

## 2017-10-19 DIAGNOSIS — G4700 Insomnia, unspecified: Secondary | ICD-10-CM

## 2017-10-19 DIAGNOSIS — K219 Gastro-esophageal reflux disease without esophagitis: Secondary | ICD-10-CM | POA: Diagnosis not present

## 2017-10-19 DIAGNOSIS — Z720 Tobacco use: Secondary | ICD-10-CM

## 2017-10-19 DIAGNOSIS — E7800 Pure hypercholesterolemia, unspecified: Secondary | ICD-10-CM | POA: Diagnosis not present

## 2017-10-19 DIAGNOSIS — E039 Hypothyroidism, unspecified: Secondary | ICD-10-CM

## 2017-10-19 MED ORDER — PRAVASTATIN SODIUM 40 MG PO TABS: 40.0000 mg | ORAL_TABLET | Freq: Every day | ORAL | 3 refills | Status: DC

## 2017-10-19 MED ORDER — LEVOTHYROXINE SODIUM 150 MCG PO TABS: 150.0000 ug | ORAL_TABLET | Freq: Every day | ORAL | 3 refills | Status: DC

## 2017-10-19 MED ORDER — RABEPRAZOLE SODIUM 20 MG PO TBEC: 20.0000 mg | DELAYED_RELEASE_TABLET | Freq: Every day | ORAL | 3 refills | Status: DC

## 2017-10-19 MED ORDER — TRAZODONE HCL 100 MG PO TABS: 100.0000 mg | ORAL_TABLET | Freq: Every day | ORAL | 3 refills | Status: DC

## 2017-10-19 MED ORDER — VARENICLINE TARTRATE 0.5 MG X 11 & 1 MG X 42 PO MISC: ORAL | 0 refills | Status: DC

## 2017-10-19 NOTE — Progress Notes (Signed)
Patient: Hunter Oconnor Male    DOB: Jun 20, 1950   67 y.o.   MRN: [id number] Visit Date: 10/19/2017  Today's Provider: Wilhemena Durie, MD   Chief Complaint  Patient presents with  . Hyperlipidemia  . Hypothyroidism  . Gastroesophageal Reflux  . Insomnia   Subjective:    HPI  Pt now retired but works part time.He has second grandchild.  Lipid/Cholesterol, Follow-up:   Last seen for this1 years ago.  Management changes since that visit include no changes. . Last Lipid Panel:    Component Value Date/Time   CHOL 197 04/24/2016 0822   TRIG 95 04/24/2016 0822   HDL 47 04/24/2016 0822   LDLCALC 131 (H) 04/24/2016 3329    Risk factors for vascular disease include smoking  He reports good compliance with treatment. He is not having side effects.  Current symptoms include none and have been stable. Weight trend: stable Prior visit with dietician: no Current diet: well balanced Current exercise: no regular exercise  Wt Readings from Last 3 Encounters:  10/19/17 138 lb (62.6 kg)  04/09/16 144 lb (65.3 kg)  03/26/15 154 lb (69.9 kg)     Hypothyroid, follow up: Patient was last seen 1 year ago. No changes were made in his medications. He is currently taking levothyroxine 132mcg daily.  Lab Results  Component Value Date   TSH 4.810 (H) 04/24/2016   Insomnia, follow up: Patient reports that trazodone does help with his symptoms. He is requesting that a refill be sent into the pharmacy.        Current Outpatient Medications:  .  aspirin 81 MG tablet, Take by mouth., Disp: , Rfl:  .  levothyroxine (SYNTHROID, LEVOTHROID) 150 MCG tablet, TAKE 1 TABLET DAILY, Disp: 90 tablet, Rfl: 3 .  pravastatin (PRAVACHOL) 40 MG tablet, TAKE 1 TABLET DAILY, Disp: 90 tablet, Rfl: 3 .  RABEprazole (ACIPHEX) 20 MG tablet, Take 1 tablet (20 mg total) by mouth daily., Disp: 90 tablet, Rfl: 3 .  traZODone (DESYREL) 100 MG tablet, TAKE 1 TABLET AT BEDTIME, Disp: 90 tablet, Rfl:  3 .  nicotine (NICODERM CQ - DOSED IN MG/24 HOURS) 14 mg/24hr patch, Place 1 patch (14 mg total) onto the skin daily. (Patient not taking: Reported on 10/19/2017), Disp: 28 patch, Rfl: 4  Review of Systems  Constitutional: Negative for activity change, appetite change, chills, diaphoresis, fatigue, fever and unexpected weight change.  HENT: Negative.   Eyes: Negative.   Respiratory: Negative for cough and shortness of breath.   Cardiovascular: Negative for chest pain, palpitations and leg swelling.  Endocrine: Negative.   Musculoskeletal: Negative for arthralgias and back pain.  Allergic/Immunologic: Negative.   Neurological: Negative for dizziness, light-headedness and headaches.  Psychiatric/Behavioral: Negative.     Social History   Tobacco Use  . Smoking status: Current Every Day Smoker  . Smokeless tobacco: Never Used  . Tobacco comment: Pt reportst that he does not smoke every day. He may go 2-3 weeks without smoking.  Substance Use Topics  . Alcohol use: Yes    Comment: 1-2 drinks on the weekends   Objective:   BP 132/62 (BP Location: Right Arm, Patient Position: Sitting, Cuff Size: Normal)   Pulse (!) 56   Temp 97.9 F (36.6 C)   Resp 16   Ht 5\' 8"  (1.727 m)   Wt 138 lb (62.6 kg)   SpO2 96%   BMI 20.98 kg/m  Vitals:   10/19/17 1536  BP: 132/62  Pulse: (!) 56  Resp: 16  Temp: 97.9 F (36.6 C)  SpO2: 96%  Weight: 138 lb (62.6 kg)  Height: 5\' 8"  (1.727 m)     Physical Exam  Constitutional: He is oriented to person, place, and time. He appears well-developed and well-nourished.  HENT:  Head: Normocephalic and atraumatic.  Right Ear: External ear normal.  Left Ear: External ear normal.  Nose: Nose normal.  Eyes: Conjunctivae are normal. No scleral icterus.  Neck: No thyromegaly present.  Cardiovascular: Normal rate, regular rhythm and normal heart sounds.  Pulmonary/Chest: Effort normal and breath sounds normal.  Abdominal: Soft.  Musculoskeletal: He  exhibits no edema.  Neurological: He is alert and oriented to person, place, and time.  Skin: Skin is warm and dry.  Psychiatric: He has a normal mood and affect. His behavior is normal. Judgment and thought content normal.        Assessment & Plan:     1. Tobacco use Pt says he is now ready to quit and wants to try Chantix. - varenicline (CHANTIX STARTING MONTH PAK) 0.5 MG X 11 & 1 MG X 42 tablet; Take one 0.5 mg tablet by mouth once daily for 3 days, then increase to one 0.5 mg tablet twice daily for 4 days, then increase to one 1 mg tablet twice daily.  Dispense: 53 tablet; Refill: 0  2. Gastroesophageal reflux disease, esophagitis presence not specified  - CBC with Differential/Platelet - RABEprazole (ACIPHEX) 20 MG tablet; Take 1 tablet (20 mg total) by mouth daily.  Dispense: 90 tablet; Refill: 3  3. Acquired hypothyroidism  - CBC with Differential/Platelet - TSH - levothyroxine (SYNTHROID, LEVOTHROID) 150 MCG tablet; Take 1 tablet (150 mcg total) by mouth daily.  Dispense: 90 tablet; Refill: 3  4. Hypercholesteremia  - Comprehensive metabolic panel - Lipid panel - pravastatin (PRAVACHOL) 40 MG tablet; Take 1 tablet (40 mg total) by mouth daily.  Dispense: 90 tablet; Refill: 3  5. Insomnia, unspecified type  - traZODone (DESYREL) 100 MG tablet; Take 1 tablet (100 mg total) by mouth at bedtime.  Dispense: 90 tablet; Refill: 3      I have done the exam and reviewed the above chart and it is accurate to the best of my knowledge. Development worker, community has been used in this note in any air is in the dictation or transcription are unintentional.  Wilhemena Durie, MD  [place]

## 2017-10-22 DIAGNOSIS — E7800 Pure hypercholesterolemia, unspecified: Secondary | ICD-10-CM | POA: Diagnosis not present

## 2017-10-22 DIAGNOSIS — E039 Hypothyroidism, unspecified: Secondary | ICD-10-CM | POA: Diagnosis not present

## 2017-10-22 DIAGNOSIS — K219 Gastro-esophageal reflux disease without esophagitis: Secondary | ICD-10-CM | POA: Diagnosis not present

## 2017-10-23 LAB — CBC WITH DIFFERENTIAL/PLATELET
BASOS: 1 %
Basophils Absolute: 0.1 10*3/uL (ref 0.0–0.2)
EOS (ABSOLUTE): 0.4 10*3/uL (ref 0.0–0.4)
EOS: 6 %
HEMOGLOBIN: 15.1 g/dL (ref 13.0–17.7)
Hematocrit: 45.9 % (ref 37.5–51.0)
Immature Grans (Abs): 0 10*3/uL (ref 0.0–0.1)
Immature Granulocytes: 0 %
LYMPHS ABS: 1.7 10*3/uL (ref 0.7–3.1)
Lymphs: 24 %
MCH: 32.3 pg (ref 26.6–33.0)
MCHC: 32.9 g/dL (ref 31.5–35.7)
MCV: 98 fL — ABNORMAL HIGH (ref 79–97)
MONOCYTES: 6 %
Monocytes Absolute: 0.5 10*3/uL (ref 0.1–0.9)
Neutrophils Absolute: 4.4 10*3/uL (ref 1.4–7.0)
Neutrophils: 63 %
Platelets: 104 10*3/uL — ABNORMAL LOW (ref 150–450)
RBC: 4.67 x10E6/uL (ref 4.14–5.80)
RDW: 13.8 % (ref 12.3–15.4)
WBC: 7 10*3/uL (ref 3.4–10.8)

## 2017-10-23 LAB — LIPID PANEL
Chol/HDL Ratio: 3.1 ratio (ref 0.0–5.0)
Cholesterol, Total: 138 mg/dL (ref 100–199)
HDL: 45 mg/dL (ref >39)
LDL Calculated: 80 mg/dL (ref 0–99)
Triglycerides: 63 mg/dL (ref 0–149)
VLDL Cholesterol Cal: 13 mg/dL (ref 5–40)

## 2017-10-23 LAB — COMPREHENSIVE METABOLIC PANEL
ALK PHOS: 83 IU/L (ref 39–117)
ALT: 19 IU/L (ref 0–44)
AST: 21 IU/L (ref 0–40)
Albumin/Globulin Ratio: 2 (ref 1.2–2.2)
Albumin: 4.2 g/dL (ref 3.6–4.8)
BUN / CREAT RATIO: 19 (ref 10–24)
BUN: 18 mg/dL (ref 8–27)
Bilirubin Total: 0.2 mg/dL (ref 0.0–1.2)
CO2: 23 mmol/L (ref 20–29)
Calcium: 9.1 mg/dL (ref 8.6–10.2)
Chloride: 106 mmol/L (ref 96–106)
Creatinine, Ser: 0.97 mg/dL (ref 0.76–1.27)
GFR calc Af Amer: 92 mL/min/{1.73_m2} (ref >59)
GFR calc non Af Amer: 80 mL/min/{1.73_m2} (ref >59)
Globulin, Total: 2.1 g/dL (ref 1.5–4.5)
Glucose: 92 mg/dL (ref 65–99)
POTASSIUM: 5.1 mmol/L (ref 3.5–5.2)
SODIUM: 145 mmol/L — AB (ref 134–144)
Total Protein: 6.3 g/dL (ref 6.0–8.5)

## 2017-10-23 LAB — TSH: TSH: 1.5 u[IU]/mL (ref 0.450–4.500)

## 2017-10-26 ENCOUNTER — Telehealth: Payer: Self-pay

## 2017-10-26 NOTE — Telephone Encounter (Signed)
Advised  ED   ----- Message from Jerrol Banana., MD sent at 10/26/2017 10:22 AM EDT ----- stable

## 2017-10-26 NOTE — Telephone Encounter (Signed)
-----   Message from Jerrol Banana., MD sent at 10/26/2017 10:22 AM EDT ----- stable

## 2017-10-28 ENCOUNTER — Other Ambulatory Visit: Payer: Self-pay | Admitting: Family Medicine

## 2017-10-28 DIAGNOSIS — E7800 Pure hypercholesterolemia, unspecified: Secondary | ICD-10-CM

## 2017-10-28 DIAGNOSIS — E039 Hypothyroidism, unspecified: Secondary | ICD-10-CM

## 2017-10-28 DIAGNOSIS — K219 Gastro-esophageal reflux disease without esophagitis: Secondary | ICD-10-CM

## 2017-10-28 DIAGNOSIS — G4700 Insomnia, unspecified: Secondary | ICD-10-CM

## 2017-10-28 DIAGNOSIS — Z720 Tobacco use: Secondary | ICD-10-CM

## 2017-10-28 MED ORDER — VARENICLINE TARTRATE 0.5 MG X 11 & 1 MG X 42 PO MISC: ORAL | 0 refills | Status: DC

## 2017-10-28 MED ORDER — TRAZODONE HCL 100 MG PO TABS: 100.0000 mg | ORAL_TABLET | Freq: Every day | ORAL | 3 refills | Status: DC

## 2017-10-28 MED ORDER — PRAVASTATIN SODIUM 40 MG PO TABS: 40.0000 mg | ORAL_TABLET | Freq: Every day | ORAL | 3 refills | Status: DC

## 2017-10-28 MED ORDER — LEVOTHYROXINE SODIUM 150 MCG PO TABS: 150.0000 ug | ORAL_TABLET | Freq: Every day | ORAL | 3 refills | Status: DC

## 2017-10-28 NOTE — Telephone Encounter (Signed)
Pt's wife Mardene Celeste contacted office for refill request on the following medications:  1. traZODone (DESYREL) 100 MG tablet 2. levothyroxine (SYNTHROID, LEVOTHROID) 150 MCG tablet 3. pravastatin (PRAVACHOL) 40 MG tablet  [facility]  The above medications were sent to the wrong pharmacy on 10/19/17 and Mardene Celeste is requesting they be sent to correct pharmacies today.    4. varenicline (CHANTIX STARTING MONTH PAK) 0.5 MG X 11 & 1 MG X 42 tablet   [facility]. The Rx looks to have been printed instead. Please send to [facility]. Please advise. Thanks TNP

## 2017-12-23 DIAGNOSIS — H353131 Nonexudative age-related macular degeneration, bilateral, early dry stage: Secondary | ICD-10-CM | POA: Diagnosis not present

## 2018-01-27 ENCOUNTER — Ambulatory Visit
Admission: RE | Admit: 2018-01-27 | Discharge: 2018-01-27 | Disposition: A | Discharge status: Home / Self Care | Payer: Medicare Other | Attending: Family Medicine | Admitting: Family Medicine

## 2018-01-27 ENCOUNTER — Ambulatory Visit
Admission: RE | Admit: 2018-01-27 | Discharge: 2018-01-27 | Disposition: A | Discharge status: Home / Self Care | Payer: Medicare Other | Source: Ambulatory Visit | Attending: Family Medicine | Admitting: Family Medicine

## 2018-01-27 ENCOUNTER — Other Ambulatory Visit: Payer: Self-pay

## 2018-01-27 ENCOUNTER — Ambulatory Visit: Payer: Medicare Other | Admitting: Family Medicine

## 2018-01-27 ENCOUNTER — Ambulatory Visit (INDEPENDENT_AMBULATORY_CARE_PROVIDER_SITE_OTHER): Payer: Medicare Other | Admitting: Family Medicine

## 2018-01-27 ENCOUNTER — Encounter: Payer: Self-pay | Admitting: Family Medicine

## 2018-01-27 VITALS — BP 132/68 | HR 56 | Temp 97.7°F | Ht 68.0 in | Wt 142.0 lb

## 2018-01-27 DIAGNOSIS — H6121 Impacted cerumen, right ear: Secondary | ICD-10-CM | POA: Diagnosis not present

## 2018-01-27 DIAGNOSIS — R202 Paresthesia of skin: Secondary | ICD-10-CM | POA: Insufficient documentation

## 2018-01-27 DIAGNOSIS — R079 Chest pain, unspecified: Secondary | ICD-10-CM

## 2018-01-27 DIAGNOSIS — R05 Cough: Secondary | ICD-10-CM | POA: Diagnosis not present

## 2018-01-27 NOTE — Patient Instructions (Signed)
We will call you with the x-ray report. Watch for a shingles rash over the next few days.

## 2018-01-27 NOTE — Progress Notes (Addendum)
  Subjective:     Patient ID: Hunter Oconnor, male   DOB: 1950/02/19, 67 y.o.   MRN: [id number] Chief Complaint  Patient presents with  . ear stuffiness    both ears  . pain in chest    upper chest and pain in left shoulder, some SOB, and chest tightness since 01/23/18   HPI States he has had persistent dull,non-exertional chest pain in his left upper chest wall for the last few days. Also has noticed a tingling/numness sensation when he rubs or the area is hit by the shower water.  Reports hx of smoking < 1 ppd , cervical disc surgery, and prior hx of shingles.  Review of Systems  Constitutional: Negative for diaphoresis.  Respiratory: Negative for shortness of breath.   Cardiovascular: Negative for palpitations.  Gastrointestinal: Negative for nausea.  Skin: Negative for rash.       Objective:   Physical Exam HENT:     Ears:     Comments: R ear obscured by cerumen. Left ear canal patent with normal ear drum. After irrigation per Desha: ear canal is patent and TM is intact without inflammation. Cardiovascular:     Rate and Rhythm: Normal rate and regular rhythm.     Heart sounds: No murmur.  Pulmonary:     Breath sounds: Normal breath sounds. No wheezing.  Chest:     Chest wall: No tenderness.  Musculoskeletal:     Right lower leg: No edema.     Left lower leg: No edema.        Assessment:    1. Chest pain, unspecified type - DG Chest 2 View; Future  2. Obstruction of ventilation tube of right ear by cerumen - EAR CERUMEN REMOVAL  3. Paresthesias - DG Chest 2 View; Future    Plan:    Further f/u pending x-ray result.

## 2018-01-28 ENCOUNTER — Other Ambulatory Visit: Payer: Self-pay | Admitting: Family Medicine

## 2018-01-28 MED ORDER — PREDNISONE 10 MG PO TABS: ORAL_TABLET | ORAL | 0 refills | Status: DC

## 2018-02-11 ENCOUNTER — Ambulatory Visit
Admission: RE | Admit: 2018-02-11 | Discharge: 2018-02-11 | Disposition: A | Discharge status: Home / Self Care | Payer: Medicare Other | Attending: Family Medicine | Admitting: Family Medicine

## 2018-02-11 ENCOUNTER — Ambulatory Visit (INDEPENDENT_AMBULATORY_CARE_PROVIDER_SITE_OTHER): Payer: Medicare Other | Admitting: Family Medicine

## 2018-02-11 ENCOUNTER — Encounter: Payer: Self-pay | Admitting: Family Medicine

## 2018-02-11 ENCOUNTER — Ambulatory Visit
Admission: RE | Admit: 2018-02-11 | Discharge: 2018-02-11 | Disposition: A | Discharge status: Home / Self Care | Payer: Medicare Other | Source: Ambulatory Visit | Attending: Family Medicine | Admitting: Family Medicine

## 2018-02-11 VITALS — BP 138/60 | HR 80 | Temp 98.2°F | Resp 16 | Wt 140.2 lb

## 2018-02-11 DIAGNOSIS — J440 Chronic obstructive pulmonary disease with acute lower respiratory infection: Secondary | ICD-10-CM | POA: Insufficient documentation

## 2018-02-11 DIAGNOSIS — J90 Pleural effusion, not elsewhere classified: Secondary | ICD-10-CM

## 2018-02-11 DIAGNOSIS — J209 Acute bronchitis, unspecified: Secondary | ICD-10-CM | POA: Diagnosis not present

## 2018-02-11 DIAGNOSIS — J9811 Atelectasis: Secondary | ICD-10-CM | POA: Diagnosis not present

## 2018-02-11 MED ORDER — PREDNISONE 10 MG PO TABS: ORAL_TABLET | ORAL | 0 refills | Status: DC

## 2018-02-11 MED ORDER — DOXYCYCLINE HYCLATE 100 MG PO TABS: 100.0000 mg | ORAL_TABLET | Freq: Two times a day (BID) | ORAL | 0 refills | Status: DC

## 2018-02-11 NOTE — Progress Notes (Signed)
Patient: Hunter Oconnor Male    DOB: 06/21/1950   68 y.o.   MRN: [id number] Visit Date: 02/11/2018  Today's Provider: Vernie Murders, PA   Chief Complaint  Patient presents with  . Cough   Subjective:     Cough  This is a recurrent (Patient was seen 2 weeks ago now cough worsening on Friday) problem. The problem has been gradually worsening. The problem occurs constantly. The cough is productive of sputum. Associated symptoms include postnasal drip, rhinorrhea and shortness of breath. Pertinent negatives include no chest pain, ear congestion, ear pain, fever, headaches, sore throat, sweats, weight loss or wheezing. Nothing aggravates the symptoms. Risk factors for lung disease include smoking/tobacco exposure. He has tried oral steroids and OTC cough suppressant (Finished Prednisone a week ago.Mucinex) for the symptoms. The treatment provided mild relief.   Patient also here to go over chest xray since it showed some fluid.  Past Medical History:  Diagnosis Date  . Hyperlipidemia   . Hypothyroid   . Insomnia    Past Surgical History:  Procedure Laterality Date  . APPENDECTOMY    . BACK SURGERY     lumbar  . CERVICAL DISC SURGERY  2008   Ruptured disc  . HERNIA REPAIR    . INGUINAL HERNIA REPAIR Right   . SHOULDER SURGERY     Family History  Problem Relation Age of Onset  . Cancer Mother        colon ca  . Hypertension Father   . Cancer Father    No Known Allergies  Current Outpatient Medications:  .  aspirin 81 MG tablet, Take by mouth., Disp: , Rfl:  .  levothyroxine (SYNTHROID, LEVOTHROID) 150 MCG tablet, Take 1 tablet (150 mcg total) by mouth daily., Disp: 90 tablet, Rfl: 3 .  pravastatin (PRAVACHOL) 40 MG tablet, Take 1 tablet (40 mg total) by mouth daily., Disp: 90 tablet, Rfl: 3 .  traZODone (DESYREL) 100 MG tablet, Take 1 tablet (100 mg total) by mouth at bedtime., Disp: 90 tablet, Rfl: 3 .  nicotine (NICODERM CQ - DOSED IN MG/24 HOURS) 14 mg/24hr  patch, Place 1 patch (14 mg total) onto the skin daily. (Patient not taking: Reported on 02/11/2018), Disp: 28 patch, Rfl: 4 .  predniSONE (DELTASONE) 10 MG tablet, Taper daily as follows: 6 pills, 5, 4, 3, 2, 1 (Patient not taking: Reported on 02/11/2018), Disp: 21 tablet, Rfl: 0 .  RABEprazole (ACIPHEX) 20 MG tablet, Take 1 tablet (20 mg total) by mouth daily. (Patient not taking: Reported on 01/27/2018), Disp: 90 tablet, Rfl: 3 .  varenicline (CHANTIX STARTING MONTH PAK) 0.5 MG X 11 & 1 MG X 42 tablet, Take one 0.5 mg tablet by mouth once daily for 3 days, then increase to one 0.5 mg tablet twice daily for 4 days, then increase to one 1 mg tablet twice daily. (Patient not taking: Reported on 01/27/2018), Disp: 53 tablet, Rfl: 0  Review of Systems  Constitutional: Negative for fever and weight loss.  HENT: Positive for congestion, postnasal drip, rhinorrhea and sneezing. Negative for ear pain, sinus pressure, sinus pain and sore throat.   Respiratory: Positive for cough, chest tightness and shortness of breath. Negative for wheezing.   Cardiovascular: Negative for chest pain, palpitations and leg swelling.  Neurological: Negative for headaches.   Social History   Tobacco Use  . Smoking status: Current Every Day Smoker  . Smokeless tobacco: Never Used  . Tobacco comment: Pt reportst that  he does not smoke every day. He may go 2-3 weeks without smoking.  Substance Use Topics  . Alcohol use: Yes    Comment: 1-2 drinks on the weekends     Objective:   BP 138/60 (BP Location: Right Arm, Patient Position: Sitting, Cuff Size: Normal)   Pulse 80   Temp 98.2 F (36.8 C) (Oral)   Resp 16   Wt 140 lb 3.2 oz (63.6 kg)   SpO2 96%   BMI 21.32 kg/m  Vitals:   02/11/18 1509  BP: 138/60  Pulse: 80  Resp: 16  Temp: 98.2 F (36.8 C)  TempSrc: Oral  SpO2: 96%  Weight: 140 lb 3.2 oz (63.6 kg)   Physical Exam Constitutional:      General: He is not in acute distress.    Appearance: He is  well-developed.  HENT:     Head: Normocephalic and atraumatic.     Right Ear: Hearing and tympanic membrane normal.     Left Ear: Hearing and tympanic membrane normal.     Nose: Rhinorrhea present.     Mouth/Throat:     Mouth: Mucous membranes are moist.     Pharynx: Oropharynx is clear.  Eyes:     General: Lids are normal. No scleral icterus.       Right eye: No discharge.        Left eye: No discharge.     Conjunctiva/sclera: Conjunctivae normal.  Neck:     Musculoskeletal: Neck supple.  Cardiovascular:     Rate and Rhythm: Normal rate and regular rhythm.  Pulmonary:     Effort: Pulmonary effort is normal. No respiratory distress.     Comments: Distant breath sounds. No wheeze or rales. Abdominal:     General: Abdomen is flat. Bowel sounds are normal.     Palpations: Abdomen is soft.  Lymphadenopathy:     Cervical: No cervical adenopathy.  Skin:    Findings: No lesion or rash.  Neurological:     Mental Status: He is alert and oriented to person, place, and time.  Psychiatric:        Speech: Speech normal.        Behavior: Behavior normal.        Thought Content: Thought content normal.       Assessment & Plan    1. Acute bronchitis with COPD ([place]) No further chest pains and felt very well until 02-05-18. Started having white sputum production, sore throat, stuffy head and cough that day. Denies fever, dyspnea or wheezing. Suspect COPD with long history of smoking. Recheck CXR, start antibiotic and refill prednisone taper. May add Mucinex-DM for congestion and cough. May need inhaler therapy if wheeze and dyspnea starts. Check CBC and CMP with recent pleural effusion. - doxycycline (VIBRA-TABS) 100 MG tablet; Take 1 tablet (100 mg total) by mouth 2 (two) times daily.  Dispense: 20 tablet; Refill: 0 - predniSONE (DELTASONE) 10 MG tablet; Taper daily as follows: 6 pills, 5, 4, 3, 2, 1  Dispense: 21 tablet; Refill: 0 - CBC with Differential/Platelet - Comprehensive metabolic  panel - DG Chest 2 View  2. Pleural effusion Small bilateral pleural effusions on CXR of 01-27-18. No chest pains remain since taking the prednisone taper. Still smoking (history of 1ppd for 52 years). Will check labs and get follow up CXR. May need CT scan if no better or worsening effusion. - CBC with Differential/Platelet - Comprehensive metabolic panel - DG Chest 2 View     [place],  [facility]

## 2018-02-12 LAB — COMPREHENSIVE METABOLIC PANEL
ALT: 19 IU/L (ref 0–44)
AST: 20 IU/L (ref 0–40)
Albumin/Globulin Ratio: 2 (ref 1.2–2.2)
Albumin: 4.3 g/dL (ref 3.6–4.8)
Alkaline Phosphatase: 90 IU/L (ref 39–117)
BUN/Creatinine Ratio: 18 (ref 10–24)
BUN: 18 mg/dL (ref 8–27)
Bilirubin Total: 0.3 mg/dL (ref 0.0–1.2)
CO2: 22 mmol/L (ref 20–29)
Calcium: 9.6 mg/dL (ref 8.6–10.2)
Chloride: 102 mmol/L (ref 96–106)
Creatinine, Ser: 1.02 mg/dL (ref 0.76–1.27)
GFR calc Af Amer: 86 mL/min/{1.73_m2} (ref >59)
GFR calc non Af Amer: 75 mL/min/{1.73_m2} (ref >59)
Globulin, Total: 2.1 g/dL (ref 1.5–4.5)
Glucose: 107 mg/dL — ABNORMAL HIGH (ref 65–99)
Potassium: 4.2 mmol/L (ref 3.5–5.2)
Sodium: 142 mmol/L (ref 134–144)
Total Protein: 6.4 g/dL (ref 6.0–8.5)

## 2018-02-12 LAB — CBC WITH DIFFERENTIAL/PLATELET
BASOS ABS: 0.1 10*3/uL (ref 0.0–0.2)
BASOS: 0 %
EOS (ABSOLUTE): 0.2 10*3/uL (ref 0.0–0.4)
EOS: 1 %
Hematocrit: 43.2 % (ref 37.5–51.0)
Hemoglobin: 15.1 g/dL (ref 13.0–17.7)
IMMATURE GRANS (ABS): 0.1 10*3/uL (ref 0.0–0.1)
IMMATURE GRANULOCYTES: 1 %
LYMPHS: 13 %
Lymphocytes Absolute: 1.5 10*3/uL (ref 0.7–3.1)
MCH: 32.6 pg (ref 26.6–33.0)
MCHC: 35 g/dL (ref 31.5–35.7)
MCV: 93 fL (ref 79–97)
MONOCYTES: 6 %
Monocytes Absolute: 0.7 10*3/uL (ref 0.1–0.9)
NEUTROS ABS: 9.4 10*3/uL — AB (ref 1.4–7.0)
Neutrophils: 79 %
PLATELETS: 109 10*3/uL — AB (ref 150–450)
RBC: 4.63 x10E6/uL (ref 4.14–5.80)
RDW: 13.2 % (ref 11.6–15.4)
WBC: 11.8 10*3/uL — ABNORMAL HIGH (ref 3.4–10.8)

## 2018-02-14 ENCOUNTER — Telehealth: Payer: Self-pay

## 2018-02-14 NOTE — Telephone Encounter (Signed)
Patient advised as directed below. 

## 2018-02-14 NOTE — Telephone Encounter (Signed)
-----   Message from [place], [place] sent at 02/14/2018  1:40 PM EST ----- Persistent small bilateral effusion with bibasilar atelectasis compared to x-rays of 01-27-18. Slight elevation of WBC count with significant increase in neutrophils suggests some infection. If no better with prednisone and antibiotics in a week, will need follow up with Dr. Rosanna Randy for possible CT scan and/or referral to a pulmonologist.

## 2018-02-15 ENCOUNTER — Telehealth: Payer: Self-pay | Admitting: Family Medicine

## 2018-02-15 DIAGNOSIS — J449 Chronic obstructive pulmonary disease, unspecified: Secondary | ICD-10-CM

## 2018-02-15 NOTE — Telephone Encounter (Signed)
Needing a referral to Dr Vella Kohler w/ [facility] - Pulmonary Dept Fax [phone].  Pt wants this since the discussed results.  Please advise.  Thanks, [employer]

## 2018-02-15 NOTE — Telephone Encounter (Signed)
ok 

## 2018-02-15 NOTE — Telephone Encounter (Signed)
Done  ED 

## 2018-02-24 ENCOUNTER — Encounter: Payer: Self-pay | Admitting: Family Medicine

## 2018-02-24 ENCOUNTER — Ambulatory Visit (INDEPENDENT_AMBULATORY_CARE_PROVIDER_SITE_OTHER): Payer: Medicare Other | Admitting: Family Medicine

## 2018-02-24 VITALS — BP 126/70 | HR 60 | Temp 98.4°F | Resp 20 | Wt 142.0 lb

## 2018-02-24 DIAGNOSIS — F17200 Nicotine dependence, unspecified, uncomplicated: Secondary | ICD-10-CM

## 2018-02-24 DIAGNOSIS — J441 Chronic obstructive pulmonary disease with (acute) exacerbation: Secondary | ICD-10-CM

## 2018-02-24 NOTE — Progress Notes (Signed)
Patient: Hunter Oconnor Male    DOB: September 17, 1950   68 y.o.   MRN: [id number] Visit Date: 02/24/2018  Today's Provider: Wilhemena Durie, MD   Chief Complaint  Patient presents with  . Follow-up   Subjective:     HPI Patient comes in today for a follow up of acute bronchitis. He was seen in the office on 02/11/2018 by Vernie Murders, PA and was treated with Doxy 100mg  BID X 10 days, prednisone taper, and labs with CXR was obtained. Patient reports that he has completed his treatment, but feels like his symptoms are coming back. He does feel that he got completely better after treatment. However, he states, "Once I'm off of prednisone, I start to feel bad again."   CXR on 02/11/2018 indicated that he had bilateral effusion, and labs had elevated WBCs. Patient is here to determine if a referral for pulmonology is needed.   No Known Allergies   Current Outpatient Medications:  .  aspirin 81 MG tablet, Take by mouth., Disp: , Rfl:  .  doxycycline (VIBRA-TABS) 100 MG tablet, Take 1 tablet (100 mg total) by mouth 2 (two) times daily., Disp: 20 tablet, Rfl: 0 .  levothyroxine (SYNTHROID, LEVOTHROID) 150 MCG tablet, Take 1 tablet (150 mcg total) by mouth daily., Disp: 90 tablet, Rfl: 3 .  nicotine (NICODERM CQ - DOSED IN MG/24 HOURS) 14 mg/24hr patch, Place 1 patch (14 mg total) onto the skin daily. (Patient not taking: Reported on 02/11/2018), Disp: 28 patch, Rfl: 4 .  pravastatin (PRAVACHOL) 40 MG tablet, Take 1 tablet (40 mg total) by mouth daily., Disp: 90 tablet, Rfl: 3 .  predniSONE (DELTASONE) 10 MG tablet, Taper daily as follows: 6 pills, 5, 4, 3, 2, 1, Disp: 21 tablet, Rfl: 0 .  RABEprazole (ACIPHEX) 20 MG tablet, Take 1 tablet (20 mg total) by mouth daily. (Patient not taking: Reported on 01/27/2018), Disp: 90 tablet, Rfl: 3 .  traZODone (DESYREL) 100 MG tablet, Take 1 tablet (100 mg total) by mouth at bedtime., Disp: 90 tablet, Rfl: 3 .  varenicline (CHANTIX STARTING MONTH  PAK) 0.5 MG X 11 & 1 MG X 42 tablet, Take one 0.5 mg tablet by mouth once daily for 3 days, then increase to one 0.5 mg tablet twice daily for 4 days, then increase to one 1 mg tablet twice daily. (Patient not taking: Reported on 01/27/2018), Disp: 53 tablet, Rfl: 0  Review of Systems  Constitutional: Negative for activity change, appetite change, chills, diaphoresis, fatigue, fever and unexpected weight change.  HENT: Positive for congestion.   Eyes: Negative.   Respiratory: Positive for cough. Negative for shortness of breath, wheezing and stridor.   Cardiovascular: Negative for chest pain, palpitations and leg swelling.  Gastrointestinal: Negative.   Allergic/Immunologic: Positive for environmental allergies.  Neurological: Negative for dizziness, light-headedness and headaches.  Psychiatric/Behavioral: Negative.     Social History   Tobacco Use  . Smoking status: Current Every Day Smoker  . Smokeless tobacco: Never Used  . Tobacco comment: Pt reportst that he does not smoke every day. He may go 2-3 weeks without smoking.  Substance Use Topics  . Alcohol use: Yes    Comment: 1-2 drinks on the weekends      Objective:   BP 126/70 (BP Location: Left Arm, Patient Position: Sitting, Cuff Size: Normal)   Pulse 60   Temp 98.4 F (36.9 C)   Resp 20   Wt 142 lb (64.4 kg)  SpO2 96%   BMI 21.59 kg/m  Vitals:   02/24/18 1348  BP: 126/70  Pulse: 60  Resp: 20  Temp: 98.4 F (36.9 C)  SpO2: 96%  Weight: 142 lb (64.4 kg)     Physical Exam Constitutional:      Appearance: Normal appearance. He is well-developed and normal weight.  HENT:     Head: Normocephalic and atraumatic.     Right Ear: External ear normal.     Left Ear: External ear normal.     Nose: Nose normal.  Eyes:     General: No scleral icterus.    Conjunctiva/sclera: Conjunctivae normal.  Neck:     Thyroid: No thyromegaly.  Cardiovascular:     Rate and Rhythm: Normal rate and regular rhythm.     Heart  sounds: Normal heart sounds.  Pulmonary:     Effort: Pulmonary effort is normal.     Breath sounds: Normal breath sounds.  Abdominal:     Palpations: Abdomen is soft.  Skin:    General: Skin is warm and dry.  Neurological:     General: No focal deficit present.     Mental Status: He is alert and oriented to person, place, and time.  Psychiatric:        Behavior: Behavior normal.        Thought Content: Thought content normal.        Judgment: Judgment normal.         Assessment & Plan    1. COPD exacerbation (HCC) Retreat including prednisone taper.  Follow-up in a couple of weeks and obtain spirometry.  Certainly need inhalers and possibly pulmonary referral.RTC 2-4 weeks.  2. Compulsive tobacco user syndrome  Patient instructed to totally quit smoking.    I have done the exam and reviewed the above chart and it is accurate to the best of my knowledge. Development worker, community has been used in this note in any air is in the dictation or transcription are unintentional.  Wilhemena Durie, MD  [place]

## 2018-03-01 ENCOUNTER — Telehealth: Payer: Self-pay | Admitting: Family Medicine

## 2018-03-01 NOTE — Telephone Encounter (Signed)
FYI--Pt refused appointment with Dr Raul Del stating that he is planning on seeing you again in 1 month and that he may not need referral to pulmonology

## 2018-03-01 NOTE — Telephone Encounter (Signed)
Please review. Thanks!  

## 2018-03-28 ENCOUNTER — Ambulatory Visit (INDEPENDENT_AMBULATORY_CARE_PROVIDER_SITE_OTHER): Payer: Medicare Other | Admitting: Family Medicine

## 2018-03-28 ENCOUNTER — Encounter: Payer: Self-pay | Admitting: Family Medicine

## 2018-03-28 VITALS — BP 118/76 | HR 72 | Temp 97.9°F | Resp 16 | Ht 68.0 in | Wt 147.0 lb

## 2018-03-28 DIAGNOSIS — J441 Chronic obstructive pulmonary disease with (acute) exacerbation: Secondary | ICD-10-CM | POA: Diagnosis not present

## 2018-03-28 DIAGNOSIS — F17200 Nicotine dependence, unspecified, uncomplicated: Secondary | ICD-10-CM

## 2018-03-28 MED ORDER — FLUTICASONE-UMECLIDIN-VILANT 100-62.5-25 MCG/INH IN AEPB: 1.0000 | INHALATION_SPRAY | Freq: Every day | RESPIRATORY_TRACT | 3 refills | Status: DC

## 2018-03-28 NOTE — Progress Notes (Signed)
Patient: Hunter Oconnor Male    DOB: [birth year]   68 y.o.   MRN: [id number] Visit Date: 03/28/2018  Today's Provider: Wilhemena Durie, MD   Chief Complaint  Patient presents with  . Follow-up   Subjective:     HPI  Patient comes in today for a follow up. He was last seen in the office 1 month ago c/o COPD exacerbation. Patient reports that he feels much better. He does still have shortness of breath, but it is not as bad as before.  He is having no chest pain.  Still has dyspnea but not nearly as much as a couple of weeks ago.  States he is cut down smoking to about 1 pack a week. No Known Allergies   Current Outpatient Medications:  .  aspirin 81 MG tablet, Take by mouth., Disp: , Rfl:  .  levothyroxine (SYNTHROID, LEVOTHROID) 150 MCG tablet, Take 1 tablet (150 mcg total) by mouth daily., Disp: 90 tablet, Rfl: 3 .  pravastatin (PRAVACHOL) 40 MG tablet, Take 1 tablet (40 mg total) by mouth daily., Disp: 90 tablet, Rfl: 3 .  traZODone (DESYREL) 100 MG tablet, Take 1 tablet (100 mg total) by mouth at bedtime., Disp: 90 tablet, Rfl: 3 .  doxycycline (VIBRA-TABS) 100 MG tablet, Take 1 tablet (100 mg total) by mouth 2 (two) times daily., Disp: 20 tablet, Rfl: 0 .  nicotine (NICODERM CQ - DOSED IN MG/24 HOURS) 14 mg/24hr patch, Place 1 patch (14 mg total) onto the skin daily. (Patient not taking: Reported on 02/11/2018), Disp: 28 patch, Rfl: 4 .  predniSONE (DELTASONE) 10 MG tablet, Taper daily as follows: 6 pills, 5, 4, 3, 2, 1 (Patient not taking: Reported on 03/28/2018), Disp: 21 tablet, Rfl: 0 .  RABEprazole (ACIPHEX) 20 MG tablet, Take 1 tablet (20 mg total) by mouth daily. (Patient not taking: Reported on 01/27/2018), Disp: 90 tablet, Rfl: 3 .  varenicline (CHANTIX STARTING MONTH PAK) 0.5 MG X 11 & 1 MG X 42 tablet, Take one 0.5 mg tablet by mouth once daily for 3 days, then increase to one 0.5 mg tablet twice daily for 4 days, then increase to one 1 mg tablet twice daily.  (Patient not taking: Reported on 01/27/2018), Disp: 53 tablet, Rfl: 0  Review of Systems  Constitutional: Negative for activity change, appetite change, chills, diaphoresis, fatigue, fever and unexpected weight change.  HENT: Positive for congestion.   Eyes: Negative.   Respiratory: Positive for cough. Negative for shortness of breath, wheezing and stridor.   Cardiovascular: Negative for chest pain, palpitations and leg swelling.  Gastrointestinal: Negative.   Endocrine: Negative.   Allergic/Immunologic: Positive for environmental allergies.  Neurological: Negative for dizziness, light-headedness and headaches.  Hematological: Negative.   Psychiatric/Behavioral: Negative.     Social History   Tobacco Use  . Smoking status: Current Every Day Smoker  . Smokeless tobacco: Never Used  . Tobacco comment: Pt reportst that he does not smoke every day. He may go 2-3 weeks without smoking.  Substance Use Topics  . Alcohol use: Yes    Comment: 1-2 drinks on the weekends      Objective:   BP 118/76 (BP Location: Left Arm, Patient Position: Sitting, Cuff Size: Normal)   Pulse 72   Temp 97.9 F (36.6 C)   Resp 16   Ht 5\' 8"  (1.727 m)   Wt 147 lb (66.7 kg)   SpO2 98%   BMI 22.35 kg/m  Vitals:  03/28/18 1528  BP: 118/76  Pulse: 72  Resp: 16  Temp: 97.9 F (36.6 C)  SpO2: 98%  Weight: 147 lb (66.7 kg)  Height: 5\' 8"  (1.727 m)     Physical Exam Vitals signs reviewed.  Constitutional:      Appearance: Normal appearance. He is well-developed and normal weight.  HENT:     Head: Normocephalic and atraumatic.     Right Ear: External ear normal.     Left Ear: External ear normal.     Nose: Nose normal.  Eyes:     General: No scleral icterus.    Conjunctiva/sclera: Conjunctivae normal.  Neck:     Thyroid: No thyromegaly.  Cardiovascular:     Rate and Rhythm: Normal rate and regular rhythm.     Heart sounds: Normal heart sounds.  Pulmonary:     Effort: Pulmonary effort  is normal.     Breath sounds: Normal breath sounds.  Abdominal:     Palpations: Abdomen is soft.  Skin:    General: Skin is warm and dry.  Neurological:     General: No focal deficit present.     Mental Status: He is alert and oriented to person, place, and time.  Psychiatric:        Mood and Affect: Mood normal.        Behavior: Behavior normal.        Thought Content: Thought content normal.        Judgment: Judgment normal.         Assessment & Plan    1. COPD exacerbation (HCC) Try Trelegy inhaler daily.  Return to clinic 3 to 4 weeks.  Consider pulmonary referral.  This is discussed with patient today and he wishes to wait on this.  He is instructed on how to use the inhaler today. - Spirometry with graph; Future - Spirometry with graph  2. Compulsive tobacco user syndrome Patient instructed to try to quit completely.  Try to refer patient for yearly screening CT of the chest.  On next visit    I have done the exam and reviewed the above chart and it is accurate to the best of my knowledge. Development worker, community has been used in this note in any air is in the dictation or transcription are unintentional.  Wilhemena Durie, MD  [place]

## 2018-04-18 ENCOUNTER — Ambulatory Visit: Payer: Self-pay | Admitting: Family Medicine

## 2018-04-25 ENCOUNTER — Ambulatory Visit (INDEPENDENT_AMBULATORY_CARE_PROVIDER_SITE_OTHER): Payer: Medicare Other | Admitting: Family Medicine

## 2018-04-25 ENCOUNTER — Ambulatory Visit: Payer: Self-pay | Admitting: Family Medicine

## 2018-04-25 ENCOUNTER — Other Ambulatory Visit: Payer: Self-pay

## 2018-04-25 ENCOUNTER — Encounter: Payer: Self-pay | Admitting: Family Medicine

## 2018-04-25 VITALS — BP 126/78 | HR 52 | Temp 97.1°F | Resp 16 | Wt 148.0 lb

## 2018-04-25 DIAGNOSIS — J441 Chronic obstructive pulmonary disease with (acute) exacerbation: Secondary | ICD-10-CM

## 2018-04-25 DIAGNOSIS — F17200 Nicotine dependence, unspecified, uncomplicated: Secondary | ICD-10-CM | POA: Diagnosis not present

## 2018-04-25 MED ORDER — MOMETASONE FUROATE 220 MCG/INH IN AEPB: 2.0000 | INHALATION_SPRAY | Freq: Every day | RESPIRATORY_TRACT | 12 refills | Status: DC

## 2018-04-25 NOTE — Progress Notes (Signed)
Patient: Hunter Oconnor Male    DOB: [birth year]   68 y.o.   MRN: [id number] Visit Date: 04/25/2018  Today's Provider: Wilhemena Durie, MD   Chief Complaint  Patient presents with  . COPD  . Follow-up   Subjective:     HPI  Patient is here to follow up for COPD. He has only taken the Trelegy inhaler sample. He feels as if it is working well, but the medication is very expensive and needs something more affordable to try. Costs $500 for Trelegy. Patient has quit smoking in the past 5 days. He is feeling great.  No Known Allergies   Current Outpatient Medications:  .  aspirin 81 MG tablet, Take by mouth., Disp: , Rfl:  .  Fluticasone-Umeclidin-Vilant (TRELEGY ELLIPTA) 100-62.5-25 MCG/INH AEPB, Inhale 1 Inhaler into the lungs daily., Disp: 90 each, Rfl: 3 .  levothyroxine (SYNTHROID, LEVOTHROID) 150 MCG tablet, Take 1 tablet (150 mcg total) by mouth daily., Disp: 90 tablet, Rfl: 3 .  pravastatin (PRAVACHOL) 40 MG tablet, Take 1 tablet (40 mg total) by mouth daily., Disp: 90 tablet, Rfl: 3 .  traZODone (DESYREL) 100 MG tablet, Take 1 tablet (100 mg total) by mouth at bedtime., Disp: 90 tablet, Rfl: 3 .  doxycycline (VIBRA-TABS) 100 MG tablet, Take 1 tablet (100 mg total) by mouth 2 (two) times daily. (Patient not taking: Reported on 04/25/2018), Disp: 20 tablet, Rfl: 0 .  nicotine (NICODERM CQ - DOSED IN MG/24 HOURS) 14 mg/24hr patch, Place 1 patch (14 mg total) onto the skin daily. (Patient not taking: Reported on 02/11/2018), Disp: 28 patch, Rfl: 4 .  predniSONE (DELTASONE) 10 MG tablet, Taper daily as follows: 6 pills, 5, 4, 3, 2, 1 (Patient not taking: Reported on 04/25/2018), Disp: 21 tablet, Rfl: 0 .  RABEprazole (ACIPHEX) 20 MG tablet, Take 1 tablet (20 mg total) by mouth daily. (Patient not taking: Reported on 01/27/2018), Disp: 90 tablet, Rfl: 3 .  varenicline (CHANTIX STARTING MONTH PAK) 0.5 MG X 11 & 1 MG X 42 tablet, Take one 0.5 mg tablet by mouth once daily for 3  days, then increase to one 0.5 mg tablet twice daily for 4 days, then increase to one 1 mg tablet twice daily. (Patient not taking: Reported on 01/27/2018), Disp: 53 tablet, Rfl: 0  Review of Systems  Constitutional: Negative.   HENT: Negative.   Eyes: Negative.   Respiratory: Positive for cough and shortness of breath.   Cardiovascular: Negative.   Gastrointestinal: Negative.   Endocrine: Negative.   Genitourinary: Negative.   Musculoskeletal: Negative.   Skin: Negative.   Allergic/Immunologic: Positive for environmental allergies.  Neurological: Negative.   Hematological: Negative.   Psychiatric/Behavioral: Negative.     Social History   Tobacco Use  . Smoking status: Former Smoker    Last attempt to quit: 04/19/2018    Years since quitting: 0.0  . Smokeless tobacco: Never Used  . Tobacco comment: Pt reportst that he does not smoke every day. He may go 2-3 weeks without smoking.  Substance Use Topics  . Alcohol use: Yes    Comment: 1-2 drinks on the weekends      Objective:   BP 126/78 (BP Location: Right Arm, Patient Position: Sitting, Cuff Size: Small)   Pulse (!) 52   Temp (!) 97.1 F (36.2 C) (Oral)   Resp 16   Wt 148 lb (67.1 kg)   SpO2 97%   BMI 22.50 kg/m  Vitals:  04/25/18 1507  BP: 126/78  Pulse: (!) 52  Resp: 16  Temp: (!) 97.1 F (36.2 C)  TempSrc: Oral  SpO2: 97%  Weight: 148 lb (67.1 kg)     Physical Exam Vitals signs reviewed.  Constitutional:      Appearance: Normal appearance.  HENT:     Head: Normocephalic and atraumatic.     Right Ear: External ear normal.     Left Ear: External ear normal.     Nose: Nose normal.  Cardiovascular:     Rate and Rhythm: Normal rate and regular rhythm.     Heart sounds: Normal heart sounds.  Pulmonary:     Effort: Pulmonary effort is normal.     Breath sounds: Normal breath sounds.  Lymphadenopathy:     Cervical: No cervical adenopathy.  Skin:    General: Skin is warm and dry.  Neurological:      General: No focal deficit present.     Mental Status: He is alert and oriented to person, place, and time. Mental status is at baseline.  Psychiatric:        Mood and Affect: Mood normal.        Behavior: Behavior normal.        Thought Content: Thought content normal.        Judgment: Judgment normal.         Assessment & Plan    1. COPD exacerbation ([place]) Much improved. D/c trelogy due to cost. Try generic albuterol and steropid inhaler.May still need pulmonary referral.Consider screening Chest CT.  - mometasone (ASMANEX, 60 METERED DOSES,) 220 MCG/INH inhaler; Inhale 2 puffs into the lungs daily.  Dispense: 1 Inhaler; Refill: 12  2. Compulsive tobacco user syndrome Pt quit 5 days ago!     Richard Cranford Mon, MD  [facility]

## 2018-04-28 ENCOUNTER — Other Ambulatory Visit: Payer: Self-pay | Admitting: Family Medicine

## 2018-04-28 DIAGNOSIS — J441 Chronic obstructive pulmonary disease with (acute) exacerbation: Secondary | ICD-10-CM

## 2018-04-29 ENCOUNTER — Telehealth: Payer: Self-pay | Admitting: *Deleted

## 2018-04-29 NOTE — Telephone Encounter (Signed)
Patient was seen in office 04/25/2018. Patient's wife called stating Asmanex rx was $300 out-of-pocket. Their insurance will fully cover Flovent. Also pt was advised albuterol inhaler would be sent in for him and it was not. Please advise?

## 2018-05-02 ENCOUNTER — Other Ambulatory Visit: Payer: Self-pay

## 2018-05-02 DIAGNOSIS — J441 Chronic obstructive pulmonary disease with (acute) exacerbation: Secondary | ICD-10-CM

## 2018-05-02 MED ORDER — FLUTICASONE PROPIONATE HFA 110 MCG/ACT IN AERO: 2.0000 | INHALATION_SPRAY | Freq: Every day | RESPIRATORY_TRACT | 11 refills | Status: DC

## 2018-05-02 NOTE — Telephone Encounter (Signed)
Please review

## 2018-05-02 NOTE — Telephone Encounter (Signed)
Sent in rx for flovent to [facility] [address] per pt.

## 2018-05-02 NOTE — Telephone Encounter (Signed)
Switch to Flovent.  2 puffs daily, 1 year refill

## 2018-06-08 ENCOUNTER — Encounter: Payer: Medicare Other | Admitting: Family Medicine

## 2018-08-16 ENCOUNTER — Encounter: Payer: Self-pay | Admitting: Family Medicine

## 2018-10-21 ENCOUNTER — Other Ambulatory Visit: Payer: Self-pay | Admitting: Family Medicine

## 2018-10-21 DIAGNOSIS — E7800 Pure hypercholesterolemia, unspecified: Secondary | ICD-10-CM

## 2018-10-21 DIAGNOSIS — Z720 Tobacco use: Secondary | ICD-10-CM

## 2018-10-21 DIAGNOSIS — E039 Hypothyroidism, unspecified: Secondary | ICD-10-CM

## 2018-10-21 DIAGNOSIS — G4700 Insomnia, unspecified: Secondary | ICD-10-CM

## 2018-10-21 NOTE — Telephone Encounter (Signed)
Pt needs a refill on   Trazodone 100 mg  Pravastatin 40 mg  Levothyroxine 150 mcg  [facility] mail order  [employer]

## 2018-10-24 MED ORDER — LEVOTHYROXINE SODIUM 150 MCG PO TABS: 150.0000 ug | ORAL_TABLET | Freq: Every day | ORAL | 3 refills | Status: DC

## 2018-10-24 MED ORDER — PRAVASTATIN SODIUM 40 MG PO TABS: 40.0000 mg | ORAL_TABLET | Freq: Every day | ORAL | 3 refills | Status: DC

## 2018-10-24 MED ORDER — TRAZODONE HCL 100 MG PO TABS: 100.0000 mg | ORAL_TABLET | Freq: Every day | ORAL | 3 refills | Status: DC

## 2019-01-10 ENCOUNTER — Other Ambulatory Visit: Payer: Self-pay | Admitting: Oral Surgery

## 2019-01-10 DIAGNOSIS — G501 Atypical facial pain: Secondary | ICD-10-CM

## 2019-01-12 ENCOUNTER — Other Ambulatory Visit: Payer: Self-pay | Admitting: General Practice

## 2019-01-12 DIAGNOSIS — G501 Atypical facial pain: Secondary | ICD-10-CM

## 2019-01-16 ENCOUNTER — Ambulatory Visit
Admission: RE | Admit: 2019-01-16 | Discharge: 2019-01-16 | Disposition: A | Discharge status: Home / Self Care | Payer: Medicare Other | Source: Ambulatory Visit | Attending: Oral Surgery | Admitting: Oral Surgery

## 2019-01-16 ENCOUNTER — Other Ambulatory Visit: Payer: Self-pay

## 2019-01-16 DIAGNOSIS — G501 Atypical facial pain: Secondary | ICD-10-CM

## 2019-02-10 ENCOUNTER — Other Ambulatory Visit: Payer: Medicare Other

## 2019-08-11 ENCOUNTER — Other Ambulatory Visit: Payer: Self-pay | Admitting: Family Medicine

## 2019-08-11 DIAGNOSIS — E7800 Pure hypercholesterolemia, unspecified: Secondary | ICD-10-CM

## 2019-08-11 NOTE — Telephone Encounter (Signed)
Requested  medications are  due for refill today yes  Requested medications are on the active medication list yes  Last refill 4/21  Last visit March 2020  Future visit scheduled no (had one and canceled)  Notes to clinic Failed protocol due to no visit within 12 months

## 2019-09-07 ENCOUNTER — Other Ambulatory Visit: Payer: Self-pay | Admitting: Family Medicine

## 2019-09-07 DIAGNOSIS — E7800 Pure hypercholesterolemia, unspecified: Secondary | ICD-10-CM

## 2019-09-08 NOTE — Telephone Encounter (Signed)
Requested medication (s) are due for refill today -yes  Requested medication (s) are on the active medication list -yes  Future visit scheduled - no  Last refill: 08/14/19  Notes to clinic: Request for RF- courtesy RF given last RF  Requested Prescriptions  Pending Prescriptions Disp Refills   pravastatin (PRAVACHOL) 40 MG tablet [Pharmacy Med Name: PRAVASTATIN  TAB 40MG ] 30 tablet 0    Sig: TAKE 1 TABLET DAILY      Cardiovascular:  Antilipid - Statins Failed - 09/07/2019 10:01 PM      Failed - Total Cholesterol in normal range and within 360 days    Cholesterol, Total  Date Value Ref Range Status  10/22/2017 138 100 - 199 mg/dL Final          Failed - LDL in normal range and within 360 days    LDL Calculated  Date Value Ref Range Status  10/22/2017 80 0 - 99 mg/dL Final          Failed - HDL in normal range and within 360 days    HDL  Date Value Ref Range Status  10/22/2017 45 >39 mg/dL Final          Failed - Triglycerides in normal range and within 360 days    Triglycerides  Date Value Ref Range Status  10/22/2017 63 0 - 149 mg/dL Final          Failed - Valid encounter within last 12 months    Recent Outpatient Visits           1 year ago COPD exacerbation ([place])   [facility] Jerrol Banana., MD   1 year ago COPD exacerbation [facility])   [facility] Jerrol Banana., MD   1 year ago COPD exacerbation [facility])   [facility] Jerrol Banana., [place]   1 year ago Acute bronchitis with COPD [facility])   [place], [place]uff, [place]   1 year ago Chest pain, unspecified type   [place], [place], [place] - Patient is not pregnant          Requested Prescriptions  Pending Prescriptions Disp Refills   pravastatin (PRAVACHOL) 40 MG tablet [Pharmacy Med Name: PRAVASTATIN  TAB 40MG ] 30 tablet 0    Sig: TAKE 1 TABLET DAILY      Cardiovascular:   Antilipid - Statins Failed - 09/07/2019 10:01 PM      Failed - Total Cholesterol in normal range and within 360 days    Cholesterol, Total  Date Value Ref Range Status  10/22/2017 138 100 - 199 mg/dL Final          Failed - LDL in normal range and within 360 days    LDL Calculated  Date Value Ref Range Status  10/22/2017 80 0 - 99 mg/dL Final          Failed - HDL in normal range and within 360 days    HDL  Date Value Ref Range Status  10/22/2017 45 >39 mg/dL Final          Failed - Triglycerides in normal range and within 360 days    Triglycerides  Date Value Ref Range Status  10/22/2017 63 0 - 149 mg/dL Final          Failed - Valid encounter within last 12 months    Recent Outpatient Visits  1 year ago COPD exacerbation [facility])   [facility] Jer[facility] Ban[facility]., MD   1 year ago COPD exacerbation [facility])   [facility] Jerrol Banana., MD   1 year ago [facility]   [facility] Jerrol Banana., [facility]   1 year ago Acute bronchitis with COPD [facility])   [place], [place]ickki Muff, [place]   1 year ago Chest pain, unspecified type   [place], [place] - Patient is not pregnant

## 2019-09-27 NOTE — Progress Notes (Signed)
I,[name],acting as a scribe for Wilhemena Durie, MD.,have documented all relevant documentation on the behalf of Wilhemena Durie, MD,as directed by  Wilhemena Durie, MD while in the presence of Wilhemena Durie, MD.   Established patient visit   Patient: Hunter Oconnor   DOB: [birth year]   69 y.o. Male  MRN: [id number] Visit Date: 09/28/2019  Today's healthcare provider: Wilhemena Durie, MD   Chief Complaint  Patient presents with  . Follow-up   Subjective    Groin Pain Primary symptoms comment: Knot on left side. This is a new problem. The current episode started 1 to 4 weeks ago (3 weeks). The problem occurs intermittently. The problem has been waxing and waning. The pain is moderate. Pertinent negatives include no abdominal pain, anorexia, chest pain, chills, constipation, coughing, diarrhea, discolored urine, dysuria, fever, flank pain, frequency, headaches, hematuria, hesitancy, joint pain, joint swelling, nausea, painful intercourse, rash, shortness of breath, sore throat, urgency, urinary retention or vomiting. Nothing aggravates the symptoms. He has tried nothing for the symptoms.  He has not been seen for a year and a half.  He needs refill on his medications.  He has not had any lab work during that time obviously. Patient states he noticed a knot on left side of his groin 3 weeks ago. Patient states knot comes and goes. Patient states the knot is about the size of a 50 cent piece. Patient states he has no urinary symptoms. Patient said he noticed that when he urinates the knot will disappear.   Patient is also here for refills on his medications.   Patient has had both Covid vaccines.  He continues to smoke but has a desire to quit and would like to try nicotine patches.     Medications: Outpatient Medications Prior to Visit  Medication Sig  . aspirin 81 MG tablet Take by mouth.  . Multiple Vitamins-Minerals (PRESERVISION AREDS 2 PO) Take by mouth.    . [DISCONTINUED] fluticasone (FLOVENT HFA) 110 MCG/ACT inhaler Inhale 2 puffs into the lungs daily.  . [DISCONTINUED] levothyroxine (SYNTHROID) 150 MCG tablet Take 1 tablet (150 mcg total) by mouth daily.  . [DISCONTINUED] pravastatin (PRAVACHOL) 40 MG tablet TAKE 1 TABLET DAILY  . [DISCONTINUED] traZODone (DESYREL) 100 MG tablet Take 1 tablet (100 mg total) by mouth at bedtime.  . Fluticasone-Umeclidin-Vilant (TRELEGY ELLIPTA) 100-62.5-25 MCG/INH AEPB Inhale 1 Inhaler into the lungs daily. (Patient not taking: Reported on 09/28/2019)  . nicotine (NICODERM CQ - DOSED IN MG/24 HOURS) 14 mg/24hr patch Place 1 patch (14 mg total) onto the skin daily. (Patient not taking: Reported on 02/11/2018)  . predniSONE (DELTASONE) 10 MG tablet Taper daily as follows: 6 pills, 5, 4, 3, 2, 1 (Patient not taking: Reported on 04/25/2018)  . RABEprazole (ACIPHEX) 20 MG tablet Take 1 tablet (20 mg total) by mouth daily. (Patient not taking: Reported on 01/27/2018)  . varenicline (CHANTIX STARTING MONTH PAK) 0.5 MG X 11 & 1 MG X 42 tablet Take one 0.5 mg tablet by mouth once daily for 3 days, then increase to one 0.5 mg tablet twice daily for 4 days, then increase to one 1 mg tablet twice daily. (Patient not taking: Reported on 01/27/2018)  . [DISCONTINUED] doxycycline (VIBRA-TABS) 100 MG tablet Take 1 tablet (100 mg total) by mouth 2 (two) times daily. (Patient not taking: Reported on 04/25/2018)   No facility-administered medications prior to visit.    Review of Systems  Constitutional: Negative for appetite change,  chills and fever.  HENT: Negative for sore throat.   Respiratory: Negative for cough, chest tightness, shortness of breath and wheezing.   Cardiovascular: Negative for chest pain and palpitations.  Gastrointestinal: Negative for abdominal pain, anorexia, constipation, diarrhea, nausea and vomiting.  Genitourinary: Negative for dysuria, flank pain, frequency, hesitancy and urgency.  Musculoskeletal:  Negative for joint pain.  Skin: Negative for rash.  Neurological: Negative for headaches.      Objective    BP (!) 154/75 (BP Location: Right Arm, Patient Position: Sitting, Cuff Size: Large)   Pulse 67   Temp 98.3 F (36.8 C) (Oral)   Resp 18   Wt 132 lb (59.9 kg)   SpO2 97%   BMI 20.07 kg/m    Physical Exam Vitals reviewed.  Constitutional:      Appearance: Normal appearance.  HENT:     Head: Normocephalic and atraumatic.     Right Ear: External ear normal.     Left Ear: External ear normal.     Nose: Nose normal.  Cardiovascular:     Rate and Rhythm: Normal rate and regular rhythm.     Heart sounds: Normal heart sounds.  Pulmonary:     Effort: Pulmonary effort is normal.     Breath sounds: Normal breath sounds.  Genitourinary:    Penis: Normal.      Testes: Normal.     Comments: Reducible left inguinal hernia Lymphadenopathy:     Cervical: No cervical adenopathy.  Skin:    General: Skin is warm and dry.  Neurological:     General: No focal deficit present.     Mental Status: He is alert and oriented to person, place, and time. Mental status is at baseline.  Psychiatric:        Mood and Affect: Mood normal.        Behavior: Behavior normal.        Thought Content: Thought content normal.        Judgment: Judgment normal.      General Appearance:    Well developed, well nourished male. Alert, cooperative, in no acute distress, appears stated age  Head:    Normocephalic, without obvious abnormality, atraumatic  Eyes:    PERRL, conjunctiva/corneas clear, EOM's intact, fundi    benign, both eyes       Ears:    Normal TM's and external ear canals, both ears  Nose:   Nares normal, septum midline, mucosa normal, no drainage   or sinus tenderness  Throat:   Lips, mucosa, and tongue normal; teeth and gums normal  Neck:   Supple, symmetrical, trachea midline, no adenopathy;       thyroid:  No enlargement/tenderness/nodules; no carotid   bruit or JVD  Back:      Symmetric, no curvature, ROM normal, no CVA tenderness  Lungs:     Clear to auscultation bilaterally, respirations unlabored  Chest wall:    No tenderness or deformity  Heart:    Normal heart rate. Normal rhythm. No murmurs, rubs, or gallops.  S1 and S2 normal  Abdomen:     Soft, non-tender, bowel sounds active all four quadrants,    no masses, no organomegaly  Genitalia:    deferred, Reducible left inguinal hernia  Rectal:    deferred  Extremities:   All extremities are intact. No cyanosis or edema  Pulses:   2+ and symmetric all extremities  Skin:   Skin color, texture, turgor normal, no rashes or lesions  Lymph nodes:  Cervical, supraclavicular, and axillary nodes normal  Neurologic:   CNII-XII intact. Normal strength, sensation and reflexes      throughout     No results found for any visits on 09/28/19.  Assessment & Plan     1. COPD  [facility]) Patient advised to quit smoking.  Presently asymptomatic. - fluticasone (FLOVENT HFA) 110 MCG/ACT inhaler; Inhale 2 puffs into the lungs daily.  Dispense: 12 g; Refill: 6 - CBC w/Diff/Platelet - Comprehensive Metabolic Panel (CMET) - TSH - Lipid panel  2. Acquired hypothyroidism  - levothyroxine (SYNTHROID) 150 MCG tablet; Take 1 tablet (150 mcg total) by mouth daily.  Dispense: 90 tablet; Refill: 3 - CBC w/Diff/Platelet - Comprehensive Metabolic Panel (CMET) - TSH - Lipid panel  3. Hypercholesteremia  - pravastatin (PRAVACHOL) 40 MG tablet; Take 1 tablet (40 mg total) by mouth daily.  Dispense: 90 tablet; Refill: 3 - CBC w/Diff/Platelet - Comprehensive Metabolic Panel (CMET) - TSH - Lipid panel  4. Insomnia, unspecified type  - traZODone (DESYREL) 100 MG tablet; Take 1 tablet (100 mg total) by mouth at bedtime.  Dispense: 90 tablet; Refill: 3 - CBC w/Diff/Platelet - Comprehensive Metabolic Panel (CMET) - TSH - Lipid panel  5. Tobacco use Patient would like to try NicoDerm patches will start with 40 mg daily. We will  arrange for a physical in the next few months. - CBC w/Diff/Platelet - Comprehensive Metabolic Panel (CMET) - TSH - Lipid panel  6. Inguinal hernia, left  - CBC w/Diff/Platelet - Comprehensive Metabolic Panel (CMET) - TSH - Lipid panel - Ambulatory referral to General Surgery   Return in about 5 months (around 02/28/2020) for CPE.         Bowie Delia Cranford Mon, MD  [facility] [phone] (phone) [phone] (fax)  [place]

## 2019-09-28 ENCOUNTER — Other Ambulatory Visit: Payer: Self-pay

## 2019-09-28 ENCOUNTER — Ambulatory Visit (INDEPENDENT_AMBULATORY_CARE_PROVIDER_SITE_OTHER): Payer: Medicare Other | Admitting: Family Medicine

## 2019-09-28 ENCOUNTER — Encounter: Payer: Self-pay | Admitting: Family Medicine

## 2019-09-28 VITALS — BP 122/72 | HR 66 | Temp 98.3°F | Resp 18 | Wt 132.0 lb

## 2019-09-28 DIAGNOSIS — E7800 Pure hypercholesterolemia, unspecified: Secondary | ICD-10-CM | POA: Diagnosis not present

## 2019-09-28 DIAGNOSIS — Z720 Tobacco use: Secondary | ICD-10-CM | POA: Diagnosis not present

## 2019-09-28 DIAGNOSIS — K4090 Unilateral inguinal hernia, without obstruction or gangrene, not specified as recurrent: Secondary | ICD-10-CM | POA: Diagnosis not present

## 2019-09-28 DIAGNOSIS — E039 Hypothyroidism, unspecified: Secondary | ICD-10-CM

## 2019-09-28 DIAGNOSIS — G4700 Insomnia, unspecified: Secondary | ICD-10-CM

## 2019-09-28 DIAGNOSIS — J441 Chronic obstructive pulmonary disease with (acute) exacerbation: Secondary | ICD-10-CM | POA: Diagnosis not present

## 2019-09-28 MED ORDER — FLOVENT HFA 110 MCG/ACT IN AERO: 2.0000 | INHALATION_SPRAY | Freq: Every day | RESPIRATORY_TRACT | 6 refills | Status: DC

## 2019-09-28 MED ORDER — PRAVASTATIN SODIUM 40 MG PO TABS: 40.0000 mg | ORAL_TABLET | Freq: Every day | ORAL | 3 refills | Status: DC

## 2019-09-28 MED ORDER — LEVOTHYROXINE SODIUM 150 MCG PO TABS: 150.0000 ug | ORAL_TABLET | Freq: Every day | ORAL | 3 refills | Status: DC

## 2019-09-28 MED ORDER — TRAZODONE HCL 100 MG PO TABS: 100.0000 mg | ORAL_TABLET | Freq: Every day | ORAL | 3 refills | Status: DC

## 2019-10-05 DIAGNOSIS — G4700 Insomnia, unspecified: Secondary | ICD-10-CM | POA: Diagnosis not present

## 2019-10-05 DIAGNOSIS — E7800 Pure hypercholesterolemia, unspecified: Secondary | ICD-10-CM | POA: Diagnosis not present

## 2019-10-05 DIAGNOSIS — Z720 Tobacco use: Secondary | ICD-10-CM | POA: Diagnosis not present

## 2019-10-05 DIAGNOSIS — E039 Hypothyroidism, unspecified: Secondary | ICD-10-CM | POA: Diagnosis not present

## 2019-10-05 DIAGNOSIS — K4090 Unilateral inguinal hernia, without obstruction or gangrene, not specified as recurrent: Secondary | ICD-10-CM | POA: Diagnosis not present

## 2019-10-05 DIAGNOSIS — J441 Chronic obstructive pulmonary disease with (acute) exacerbation: Secondary | ICD-10-CM | POA: Diagnosis not present

## 2019-10-06 LAB — COMPREHENSIVE METABOLIC PANEL
ALT: 19 IU/L (ref 0–44)
AST: 13 IU/L (ref 0–40)
Albumin/Globulin Ratio: 2 (ref 1.2–2.2)
Albumin: 4.4 g/dL (ref 3.8–4.8)
Alkaline Phosphatase: 94 IU/L (ref 48–121)
BUN/Creatinine Ratio: 18 (ref 10–24)
BUN: 19 mg/dL (ref 8–27)
Bilirubin Total: 0.3 mg/dL (ref 0.0–1.2)
CO2: 23 mmol/L (ref 20–29)
Calcium: 9.5 mg/dL (ref 8.6–10.2)
Chloride: 103 mmol/L (ref 96–106)
Creatinine, Ser: 1.08 mg/dL (ref 0.76–1.27)
GFR calc Af Amer: 80 mL/min/{1.73_m2} (ref >59)
GFR calc non Af Amer: 69 mL/min/{1.73_m2} (ref >59)
Globulin, Total: 2.2 g/dL (ref 1.5–4.5)
Glucose: 95 mg/dL (ref 65–99)
Potassium: 4.6 mmol/L (ref 3.5–5.2)
Sodium: 140 mmol/L (ref 134–144)
Total Protein: 6.6 g/dL (ref 6.0–8.5)

## 2019-10-06 LAB — CBC WITH DIFFERENTIAL/PLATELET
Basophils Absolute: 0.1 10*3/uL (ref 0.0–0.2)
Basos: 1 %
EOS (ABSOLUTE): 0.6 10*3/uL — ABNORMAL HIGH (ref 0.0–0.4)
Eos: 7 %
Hematocrit: 46.2 % (ref 37.5–51.0)
Hemoglobin: 15.3 g/dL (ref 13.0–17.7)
Immature Grans (Abs): 0 10*3/uL (ref 0.0–0.1)
Immature Granulocytes: 0 %
Lymphocytes Absolute: 1.8 10*3/uL (ref 0.7–3.1)
Lymphs: 22 %
MCH: 32 pg (ref 26.6–33.0)
MCHC: 33.1 g/dL (ref 31.5–35.7)
MCV: 97 fL (ref 79–97)
Monocytes Absolute: 0.5 10*3/uL (ref 0.1–0.9)
Monocytes: 6 %
Neutrophils Absolute: 5.1 10*3/uL (ref 1.4–7.0)
Neutrophils: 64 %
Platelets: 120 10*3/uL — ABNORMAL LOW (ref 150–450)
RBC: 4.78 x10E6/uL (ref 4.14–5.80)
RDW: 13.4 % (ref 11.6–15.4)
WBC: 8.1 10*3/uL (ref 3.4–10.8)

## 2019-10-06 LAB — LIPID PANEL
Chol/HDL Ratio: 3 ratio (ref 0.0–5.0)
Cholesterol, Total: 144 mg/dL (ref 100–199)
HDL: 48 mg/dL (ref >39)
LDL Chol Calc (NIH): 81 mg/dL (ref 0–99)
Triglycerides: 74 mg/dL (ref 0–149)
VLDL Cholesterol Cal: 15 mg/dL (ref 5–40)

## 2019-10-06 LAB — TSH: TSH: 2.49 u[IU]/mL (ref 0.450–4.500)

## 2019-10-11 ENCOUNTER — Telehealth: Payer: Self-pay

## 2019-10-11 NOTE — Telephone Encounter (Signed)
Patient advised.

## 2019-10-11 NOTE — Telephone Encounter (Signed)
-----   Message from Jerrol Banana., MD sent at 10/11/2019  2:58 PM EDT ----- Labs stable

## 2019-10-12 ENCOUNTER — Ambulatory Visit: Payer: Self-pay | Admitting: Surgery

## 2019-10-12 ENCOUNTER — Encounter: Payer: Self-pay | Admitting: Surgery

## 2019-10-12 ENCOUNTER — Other Ambulatory Visit: Payer: Self-pay

## 2019-10-12 ENCOUNTER — Telehealth: Payer: Self-pay

## 2019-10-12 ENCOUNTER — Ambulatory Visit (INDEPENDENT_AMBULATORY_CARE_PROVIDER_SITE_OTHER): Payer: Medicare Other | Admitting: Surgery

## 2019-10-12 VITALS — BP 135/82 | HR 68 | Temp 98.8°F | Resp 12 | Ht 68.0 in | Wt 133.0 lb

## 2019-10-12 DIAGNOSIS — K4090 Unilateral inguinal hernia, without obstruction or gangrene, not specified as recurrent: Secondary | ICD-10-CM

## 2019-10-12 NOTE — Progress Notes (Signed)
Patient ID: Hunter Oconnor, male   DOB: Dec 15, 1950, 69 y.o.   MRN: [id number]  Chief Complaint: Left inguinal hernia  History of Present Illness Hunter Oconnor is a 69 y.o. male with prior history of right inguinal hernia repair, possibly even a recurrence on that same side repaired approximately 10 to 12 years ago.  His initial operation was in 1968.  He has had complaints of left groin bulge progressively more painful over the last month.  He does a fair amount of lifting moving bending twisting working at [employer].  He denies any problems urinating.  He is a smoker with no desire to quit, or even cessation for a temporary interval for surgery.  Past Medical History Past Medical History:  Diagnosis Date  . Hyperlipidemia   . Hypothyroid   . Insomnia       Past Surgical History:  Procedure Laterality Date  . APPENDECTOMY    . BACK SURGERY     lumbar  . CERVICAL DISC SURGERY  2008   Ruptured disc  . HERNIA REPAIR    . INGUINAL HERNIA REPAIR Right   . SHOULDER SURGERY      No Known Allergies  Current Outpatient Medications  Medication Sig Dispense Refill  . fluticasone (FLOVENT HFA) 110 MCG/ACT inhaler Inhale 2 puffs into the lungs daily. 12 g 6  . levothyroxine (SYNTHROID) 150 MCG tablet Take 1 tablet (150 mcg total) by mouth daily. 90 tablet 3  . Multiple Vitamins-Minerals (PRESERVISION AREDS 2 PO) Take by mouth.    . pravastatin (PRAVACHOL) 40 MG tablet Take 1 tablet (40 mg total) by mouth daily. 90 tablet 3  . traZODone (DESYREL) 100 MG tablet Take 1 tablet (100 mg total) by mouth at bedtime. 90 tablet 3  . Fluticasone-Umeclidin-Vilant (TRELEGY ELLIPTA) 100-62.5-25 MCG/INH AEPB Inhale 1 Inhaler into the lungs daily. (Patient not taking: Reported on 09/28/2019) 90 each 3   No current facility-administered medications for this visit.    Family History Family History  Problem Relation Age of Onset  . Cancer Mother        colon ca  . Hypertension Father   .  Cancer Father       Social History Social History   Tobacco Use  . Smoking status: Current Every Day Smoker    Last attempt to quit: 04/19/2018    Years since quitting: 1.4  . Smokeless tobacco: Never Used  . Tobacco comment: Pt reportst that he does smoke every day. He may go 2-3 weeks without smoking.  Substance Use Topics  . Alcohol use: Yes    Comment: 1-2 drinks on the weekends  . Drug use: No        Review of Systems  Constitutional: Negative.   HENT: Negative.   Eyes: Positive for blurred vision (Visual changes secondary to macular degeneration.).  Respiratory: Negative.   Cardiovascular: Negative.   Gastrointestinal: Positive for abdominal pain.  Genitourinary: Negative.   Skin: Positive for rash (Psoriasis primarily involving hands.).  Neurological: Negative.   Psychiatric/Behavioral: Negative.       Physical Exam Blood pressure 135/82, pulse 68, temperature 98.8 F (37.1 C), resp. rate 12, height 5\' 8"  (1.727 m), weight 133 lb (60.3 kg), SpO2 97 %. Last Weight  Most recent update: 10/12/2019 11:14 AM   Weight  60.3 kg (133 lb)            CONSTITUTIONAL: Well developed, thin male, and adequately nourished, appropriately responsive and aware without distress.   EYES:  Sclera non-icteric.   EARS, NOSE, MOUTH AND THROAT: Mask worn.    Hearing is intact to voice.  NECK: Trachea is midline, and there is no jugular venous distension.  LYMPH NODES:  Lymph nodes in the neck are not enlarged. RESPIRATORY:  Lungs are clear, and breath sounds are equal bilaterally. Normal respiratory effort without pathologic use of accessory muscles. CARDIOVASCULAR: Heart is regular in rate and rhythm. GI: The abdomen is soft, nontender, and nondistended. There were no palpable masses. I did not appreciate hepatosplenomegaly. There were normal bowel sounds. GU: Left groin bulge readily visible, there is minimal subcutaneous adipose on his gentleman.  There is no appreciable hernia on  the right side.  Right groin scar noted.  Bilateral testicles descended normal in palpation.  Uncircumcised male phallus. MUSCULOSKELETAL:  Symmetrical muscle tone appreciated in all four extremities.    SKIN: Skin turgor is normal. No pathologic skin lesions appreciated.  NEUROLOGIC:  Motor and sensation appear grossly normal.  Cranial nerves are grossly without defect. PSYCH:  Alert and oriented to person, place and time. Affect is appropriate for situation.  Data Reviewed I have personally reviewed what is currently available of the patient's imaging, recent labs and medical records.   Labs:  CBC Latest Ref Rng & Units 10/05/2019 02/11/2018 10/22/2017  WBC 3.4 - 10.8 x10E3/uL 8.1 11.8(H) 7.0  Hemoglobin 13.0 - 17.7 g/dL 15.3 15.1 15.1  Hematocrit 37.5 - 51.0 % 46.2 43.2 45.9  Platelets 150 - 450 x10E3/uL 120(L) 109(L) 104(L)   CMP Latest Ref Rng & Units 10/05/2019 02/11/2018 10/22/2017  Glucose 65 - 99 mg/dL 95 107(H) 92  BUN 8 - 27 mg/dL 19 18 18   Creatinine 0.76 - 1.27 mg/dL 1.08 1.02 0.97  Sodium 134 - 144 mmol/L 140 142 145(H)  Potassium 3.5 - 5.2 mmol/L 4.6 4.2 5.1  Chloride 96 - 106 mmol/L 103 102 106  CO2 20 - 29 mmol/L 23 22 23   Calcium 8.6 - 10.2 mg/dL 9.5 9.6 9.1  Total Protein 6.0 - 8.5 g/dL 6.6 6.4 6.3  Total Bilirubin 0.0 - 1.2 mg/dL 0.3 0.3 0.2  Alkaline Phos 48 - 121 IU/L 94 90 83  AST 0 - 40 IU/L 13 20 21   ALT 0 - 44 IU/L 19 19 19       Imaging: Within last 24 hrs: No results found.  Assessment    Left inguinal hernia. Patient Active Problem List   Diagnosis Date Noted  . Borderline hypertension 07/10/2014  . 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 CAFL (chronic airflow limitation) ([place]) 07/10/2014  . Vitamin D deficiency 07/10/2014  . Hypercholesteremia 07/10/2014  . Acid reflux 07/10/2014  . Abdominal hernia 07/10/2014  . Blood glucose elevated 07/10/2014  . HLD (hyperlipidemia) 07/10/2014  . Acquired hypothyroidism 07/10/2014  . Cannot sleep  07/10/2014  . Psoriasis 07/10/2014  . Compulsive tobacco user syndrome 07/10/2014  . Avitaminosis D 07/10/2014    Plan    Robotic left inguinal hernia repair. I discussed possibility of incarceration, strangulation, enlargement in size over time, and the risk of emergency surgery in the face of strangulation.  Also discussed the risk of surgery including recurrence which can be up to 30% in the case of complex hernias, use of prosthetic materials (mesh) and the increased risk of infxn, post-op infxn and the possible need for re-operation and removal of mesh if used, possibility of post-op SBO or ileus, and the risks of general anesthetic including MI, CVA, sudden death or even reaction to anesthetic medications.  The patient and family understands the risks, any and all questions were answered to the patient's satisfaction.  Face-to-face time spent with the patient and accompanying care providers(if present) was 30 minutes, with more than 50% of the time spent counseling, educating, and coordinating care of the patient.      Ronny Bacon M.D., FACS 10/12/2019, 12:03 PM

## 2019-10-12 NOTE — Telephone Encounter (Signed)
Patient called to ask how long he will be out of work- approximately 2 weeks or longer- patient is planning trip to [place] 10/20/19 and would like to speak to surgery scheduler Pamala Hurry. Patient will call back @ 1:30 if [facility] hasn't called him before this time.

## 2019-10-12 NOTE — H&P (View-Only) (Signed)
Patient ID: Hunter Oconnor, male   DOB: 03/16/1950, 69 y.o.   MRN: [id number]  Chief Complaint: Left inguinal hernia  History of Present Illness Hunter Oconnor is a 69 y.o. male with prior history of right inguinal hernia repair, possibly even a recurrence on that same side repaired approximately 10 to 12 years ago.  His initial operation was in 1968.  He has had complaints of left groin bulge progressively more painful over the last month.  He does a fair amount of lifting moving bending twisting working at [employer].  He denies any problems urinating.  He is a smoker with no desire to quit, or even cessation for a temporary interval for surgery.  Past Medical History Past Medical History:  Diagnosis Date  . Hyperlipidemia   . Hypothyroid   . Insomnia       Past Surgical History:  Procedure Laterality Date  . APPENDECTOMY    . BACK SURGERY     lumbar  . CERVICAL DISC SURGERY  2008   Ruptured disc  . HERNIA REPAIR    . INGUINAL HERNIA REPAIR Right   . SHOULDER SURGERY      No Known Allergies  Current Outpatient Medications  Medication Sig Dispense Refill  . fluticasone (FLOVENT HFA) 110 MCG/ACT inhaler Inhale 2 puffs into the lungs daily. 12 g 6  . levothyroxine (SYNTHROID) 150 MCG tablet Take 1 tablet (150 mcg total) by mouth daily. 90 tablet 3  . Multiple Vitamins-Minerals (PRESERVISION AREDS 2 PO) Take by mouth.    . pravastatin (PRAVACHOL) 40 MG tablet Take 1 tablet (40 mg total) by mouth daily. 90 tablet 3  . traZODone (DESYREL) 100 MG tablet Take 1 tablet (100 mg total) by mouth at bedtime. 90 tablet 3  . Fluticasone-Umeclidin-Vilant (TRELEGY ELLIPTA) 100-62.5-25 MCG/INH AEPB Inhale 1 Inhaler into the lungs daily. (Patient not taking: Reported on 09/28/2019) 90 each 3   No current facility-administered medications for this visit.    Family History Family History  Problem Relation Age of Onset  . Cancer Mother        colon ca  . Hypertension Father   .  Cancer Father       Social History Social History   Tobacco Use  . Smoking status: Current Every Day Smoker    Last attempt to quit: 04/19/2018    Years since quitting: 1.4  . Smokeless tobacco: Never Used  . Tobacco comment: Pt reportst that he does smoke every day. He may go 2-3 weeks without smoking.  Substance Use Topics  . Alcohol use: Yes    Comment: 1-2 drinks on the weekends  . Drug use: No        Review of Systems  Constitutional: Negative.   HENT: Negative.   Eyes: Positive for blurred vision (Visual changes secondary to macular degeneration.).  Respiratory: Negative.   Cardiovascular: Negative.   Gastrointestinal: Positive for abdominal pain.  Genitourinary: Negative.   Skin: Positive for rash (Psoriasis primarily involving hands.).  Neurological: Negative.   Psychiatric/Behavioral: Negative.       Physical Exam Blood pressure 135/82, pulse 68, temperature 98.8 F (37.1 C), resp. rate 12, height 5\' 8"  (1.727 m), weight 133 lb (60.3 kg), SpO2 97 %. Last Weight  Most recent update: 10/12/2019 11:14 AM   Weight  60.3 kg (133 lb)            CONSTITUTIONAL: Well developed, thin male, and adequately nourished, appropriately responsive and aware without distress.   EYES:  Sclera non-icteric.   EARS, NOSE, MOUTH AND THROAT: Mask worn.    Hearing is intact to voice.  NECK: Trachea is midline, and there is no jugular venous distension.  LYMPH NODES:  Lymph nodes in the neck are not enlarged. RESPIRATORY:  Lungs are clear, and breath sounds are equal bilaterally. Normal respiratory effort without pathologic use of accessory muscles. CARDIOVASCULAR: Heart is regular in rate and rhythm. GI: The abdomen is soft, nontender, and nondistended. There were no palpable masses. I did not appreciate hepatosplenomegaly. There were normal bowel sounds. GU: Left groin bulge readily visible, there is minimal subcutaneous adipose on his gentleman.  There is no appreciable hernia on  the right side.  Right groin scar noted.  Bilateral testicles descended normal in palpation.  Uncircumcised male phallus. MUSCULOSKELETAL:  Symmetrical muscle tone appreciated in all four extremities.    SKIN: Skin turgor is normal. No pathologic skin lesions appreciated.  NEUROLOGIC:  Motor and sensation appear grossly normal.  Cranial nerves are grossly without defect. PSYCH:  Alert and oriented to person, place and time. Affect is appropriate for situation.  Data Reviewed I have personally reviewed what is currently available of the patient's imaging, recent labs and medical records.   Labs:  CBC Latest Ref Rng & Units 10/05/2019 02/11/2018 10/22/2017  WBC 3.4 - 10.8 x10E3/uL 8.1 11.8(H) 7.0  Hemoglobin 13.0 - 17.7 g/dL 15.3 15.1 15.1  Hematocrit 37.5 - 51.0 % 46.2 43.2 45.9  Platelets 150 - 450 x10E3/uL 120(L) 109(L) 104(L)   CMP Latest Ref Rng & Units 10/05/2019 02/11/2018 10/22/2017  Glucose 65 - 99 mg/dL 95 107(H) 92  BUN 8 - 27 mg/dL 19 18 18   Creatinine 0.76 - 1.27 mg/dL 1.08 1.02 0.97  Sodium 134 - 144 mmol/L 140 142 145(H)  Potassium 3.5 - 5.2 mmol/L 4.6 4.2 5.1  Chloride 96 - 106 mmol/L 103 102 106  CO2 20 - 29 mmol/L 23 22 23   Calcium 8.6 - 10.2 mg/dL 9.5 9.6 9.1  Total Protein 6.0 - 8.5 g/dL 6.6 6.4 6.3  Total Bilirubin 0.0 - 1.2 mg/dL 0.3 0.3 0.2  Alkaline Phos 48 - 121 IU/L 94 90 83  AST 0 - 40 IU/L 13 20 21   ALT 0 - 44 IU/L 19 19 19       Imaging: Within last 24 hrs: No results found.  Assessment    Left inguinal hernia. Patient Active Problem List   Diagnosis Date Noted  . Borderline hypertension 07/10/2014  . 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 CAFL (chronic airflow limitation) ([place]) 07/10/2014  . Vitamin D deficiency 07/10/2014  . Hypercholesteremia 07/10/2014  . Acid reflux 07/10/2014  . Abdominal hernia 07/10/2014  . Blood glucose elevated 07/10/2014  . HLD (hyperlipidemia) 07/10/2014  . Acquired hypothyroidism 07/10/2014  . Cannot sleep  07/10/2014  . Psoriasis 07/10/2014  . Compulsive tobacco user syndrome 07/10/2014  . Avitaminosis D 07/10/2014    Plan    Robotic left inguinal hernia repair. I discussed possibility of incarceration, strangulation, enlargement in size over time, and the risk of emergency surgery in the face of strangulation.  Also discussed the risk of surgery including recurrence which can be up to 30% in the case of complex hernias, use of prosthetic materials (mesh) and the increased risk of infxn, post-op infxn and the possible need for re-operation and removal of mesh if used, possibility of post-op SBO or ileus, and the risks of general anesthetic including MI, CVA, sudden death or even reaction to anesthetic medications.  The patient and family understands the risks, any and all questions were answered to the patient's satisfaction.  Face-to-face time spent with the patient and accompanying care providers(if present) was 30 minutes, with more than 50% of the time spent counseling, educating, and coordinating care of the patient.      Ronny Bacon M.D., FACS 10/12/2019, 12:03 PM

## 2019-10-12 NOTE — Patient Instructions (Signed)
Our surgery scheduler will contact you to schedule your surgery. Please have the blue sheet available when she calls.   Call the office if you have any questions or concerns.   Inguinal Hernia, Adult An inguinal hernia develops when fat or the intestines push through a weak spot in a muscle where your leg meets your lower abdomen (groin). This creates a bulge. This kind of hernia could also be:  In your scrotum, if you are male.  In folds of skin around your vagina, if you are male. There are three types of inguinal hernias:  Hernias that can be pushed back into the abdomen (are reducible). This type rarely causes pain.  Hernias that are not reducible (are incarcerated).  Hernias that are not reducible and lose their blood supply (are strangulated). This type of hernia requires emergency surgery. What are the causes? This condition is caused by having a weak spot in the muscles or tissues in the groin. This weak spot develops over time. The hernia may poke through the weak spot when you suddenly strain your lower abdominal muscles, such as when you:  Lift a heavy object.  Strain to have a bowel movement. Constipation can lead to straining.  Cough. What increases the risk? This condition is more likely to develop in:  Men.  Pregnant women.  People who: ? Are overweight. ? Work in jobs that require long periods of standing or heavy lifting. ? Have had an inguinal hernia before. ? Smoke or have lung disease. These factors can lead to long-lasting (chronic) coughing. What are the signs or symptoms? Symptoms may depend on the size of the hernia. Often, a small inguinal hernia has no symptoms. Symptoms of a larger hernia may include:  A lump in the groin area. This is easier to see when standing. It might not be visible when lying down.  Pain or burning in the groin. This may get worse when lifting, straining, or coughing.  A dull ache or a feeling of pressure in the  groin.  In men, an unusual lump in the scrotum. Symptoms of a strangulated inguinal hernia may include:  A bulge in your groin that is very painful and tender to the touch.  A bulge that turns red or purple.  Fever, nausea, and vomiting.  Inability to have a bowel movement or to pass gas. How is this diagnosed? This condition is diagnosed based on your symptoms, your medical history, and a physical exam. Your health care provider may feel your groin area and ask you to cough. How is this treated? Treatment depends on the size of your hernia and whether you have symptoms. If you do not have symptoms, your health care provider may have you watch your hernia carefully and have you come in for follow-up visits. If your hernia is large or if you have symptoms, you may need surgery to repair the hernia. Follow these instructions at home: Lifestyle  Avoid lifting heavy objects.  Avoid standing for long periods of time.  Do not use any products that contain nicotine or tobacco, such as cigarettes and e-cigarettes. If you need help quitting, ask your health care provider.  Maintain a healthy weight. Preventing constipation  Take actions to prevent constipation. Constipation leads to straining with bowel movements, which can make a hernia worse or cause a hernia repair to break down. Your health care provider may recommend that you: ? Drink enough fluid to keep your urine pale yellow. ? Eat foods that are high  in fiber, such as fresh fruits and vegetables, whole grains, and beans. ? Limit foods that are high in fat and processed sugars, such as fried or sweet foods. ? Take an over-the-counter or prescription medicine for constipation. General instructions  You may try to push the hernia back in place by very gently pressing on it while lying down. Do not try to force the bulge back in if it will not push in easily.  Watch your hernia for any changes in shape, size, or color. Get help right  away if you notice any changes.  Take over-the-counter and prescription medicines only as told by your health care provider.  Keep all follow-up visits as told by your health care provider. This is important. Contact a health care provider if:  You have a fever.  You develop new symptoms.  Your symptoms get worse. Get help right away if:  You have pain in your groin that suddenly gets worse.  You have a bulge in your groin that: ? Suddenly gets bigger and does not get smaller. ? Becomes red or purple or painful to the touch.  You are a man and you have a sudden pain in your scrotum, or the size of your scrotum suddenly changes.  You cannot push the hernia back in place by very gently pressing on it when you are lying down. Do not try to force the bulge back in if it will not push in easily.  You have nausea or vomiting that does not go away.  You have a fast heartbeat.  You cannot have a bowel movement or pass gas. These symptoms may represent a serious problem that is an emergency. Do not wait to see if the symptoms will go away. Get medical help right away. Call your local emergency services (911 in the [place] Summary  An inguinal hernia develops when fat or the intestines push through a weak spot in a muscle where your leg meets your lower abdomen (groin).  This condition is caused by having a weak spot in muscles or tissue in your groin.  Symptoms may depend on the size of the hernia, and they may include pain or swelling in your groin. A small inguinal hernia often has no symptoms.  Treatment may not be needed if you do not have symptoms. If you have symptoms or a large hernia, you may need surgery to repair the hernia.  Avoid lifting heavy objects. Also avoid standing for long amounts of time. This information is not intended to replace advice given to you by your health care provider. Make sure you discuss any questions you have with your health care  provider. Document Revised: 02/20/2017 Document Reviewed: 10/21/2016 Elsevier Patient Education  [place].

## 2019-10-13 ENCOUNTER — Telehealth: Payer: Self-pay | Admitting: Surgery

## 2019-10-13 NOTE — Telephone Encounter (Signed)
Pt has been advised of Pre-Admission date/time, COVID Testing date and Surgery date.  Surgery Date: 11/10/19 Preadmission Testing Date: 11/03/19 (phone 8a-1p) Covid Testing Date: 11/08/19 - patient advised to go to the [facility] between 8a-1p   Patient has been made aware to call [phone], between 1-3:00pm the day before surgery, to find out what time to arrive for surgery.

## 2019-10-16 NOTE — Progress Notes (Signed)
Subjective:   Hunter Oconnor is a 69 y.o. male who presents for an Initial Medicare Annual Wellness Visit.  I connected with Nani Skillern today by telephone and verified that I am speaking with the correct person using two identifiers. Location patient: home Location provider: work Persons participating in the virtual visit: patient, provider.   I discussed the limitations, risks, security and privacy concerns of performing an evaluation and management service by telephone and the availability of in person appointments. I also discussed with the patient that there may be a patient responsible charge related to this service. The patient expressed understanding and verbally consented to this telephonic visit.    Interactive audio and video telecommunications were attempted between this provider and patient, however failed, due to patient having technical difficulties OR patient did not have access to video capability.  We continued and completed visit with audio only.   Review of Systems    N/A  Cardiac Risk Factors include: advanced age (>6men, >3 women);male gender;dyslipidemia;smoking/ tobacco exposure     Objective:    Today's Vitals   10/17/19 0814  PainSc: 8    There is no height or weight on file to calculate BMI.  Advanced Directives 10/17/2019 03/26/2015  Does Patient Have a Medical Advance Directive? Yes Yes  Type of Paramedic of [place];Living will [place];Living will  Copy of [place] in Chart? No - copy requested -    Current Medications (verified) Outpatient Encounter Medications as of 10/17/2019  Medication Sig  . fluticasone (FLOVENT HFA) 110 MCG/ACT inhaler Inhale 2 puffs into the lungs daily.  Marland Kitchen levothyroxine (SYNTHROID) 150 MCG tablet Take 1 tablet (150 mcg total) by mouth daily.  . Multiple Vitamins-Minerals (PRESERVISION AREDS 2 PO) Take by mouth.  . pravastatin (PRAVACHOL) 40 MG tablet  Take 1 tablet (40 mg total) by mouth daily.  . traZODone (DESYREL) 100 MG tablet Take 1 tablet (100 mg total) by mouth at bedtime.  . Fluticasone-Umeclidin-Vilant (TRELEGY ELLIPTA) 100-62.5-25 MCG/INH AEPB Inhale 1 Inhaler into the lungs daily. (Patient not taking: Reported on 09/28/2019)   No facility-administered encounter medications on file as of 10/17/2019.    Allergies (verified) Patient has no known allergies.   History: Past Medical History:  Diagnosis Date  . Hernia of abdominal wall   . Hyperlipidemia   . Hypothyroid   . Insomnia    Past Surgical History:  Procedure Laterality Date  . APPENDECTOMY    . BACK SURGERY     lumbar  . CERVICAL DISC SURGERY  2008   Ruptured disc  . HERNIA REPAIR    . INGUINAL HERNIA REPAIR Right   . SHOULDER SURGERY     Family History  Problem Relation Age of Onset  . Cancer Mother        colon ca  . Hypertension Father   . Cancer Father    Social History   Socioeconomic History  . Marital status: Married    Spouse name: Not on file  . Number of children: 1  . Years of education: Not on file  . Highest education level: Associate degree: occupational, Hotel manager, or vocational program  Occupational History    Comment: part time  Tobacco Use  . Smoking status: Current Every Day Smoker    Packs/day: 0.50    Types: Cigarettes  . Smokeless tobacco: Never Used  Vaping Use  . Vaping Use: Never used  Substance and Sexual Activity  . Alcohol use: Yes  Comment: 1-2 drinks on the weekends  . Drug use: No  . Sexual activity: Not on file  Other Topics Concern  . Not on file  Social History Narrative  . Not on file   Social Determinants of Health   Financial Resource Strain: Low Risk   . Difficulty of Paying Living Expenses: Not hard at all  Food Insecurity: No Food Insecurity  . Worried About Charity fundraiser in the Last Year: Never true  . Ran Out of Food in the Last Year: Never true  Transportation Needs: No  Transportation Needs  . Lack of Transportation (Medical): No  . Lack of Transportation (Non-Medical): No  Physical Activity: Inactive  . Days of Exercise per Week: 0 days  . Minutes of Exercise per Session: 0 min  Stress: No Stress Concern Present  . Feeling of Stress : Not at all  Social Connections: Moderately Isolated  . Frequency of Communication with Friends and Family: More than three times a week  . Frequency of Social Gatherings with Friends and Family: More than three times a week  . Attends Religious Services: Never  . Active Member of Clubs or Organizations: No  . Attends Archivist Meetings: Never  . Marital Status: Married    Tobacco Counseling Ready to quit: Yes Counseling given: No   Clinical Intake:  Pre-visit preparation completed: Yes  Pain : 0-10 (Due to hernia - sx scheduled for 11/10/19.) Pain Score: 8  Pain Type: Acute pain Pain Location: Abdomen Pain Orientation: Lower Pain Descriptors / Indicators: Aching Pain Frequency: Intermittent Pain Relieving Factors: None  Pain Relieving Factors: None  Nutritional Risks: None Diabetes: No  How often do you need to have someone help you when you read instructions, pamphlets, or other written materials from your doctor or pharmacy?: 1 - Never  Diabetic? No  Interpreter Needed?: No  Information entered by :: MMarkoski, LPN   Activities of Daily Living In your present state of health, do you have any difficulty performing the following activities: 10/17/2019  Hearing? N  Vision? N  Difficulty concentrating or making decisions? N  Walking or climbing stairs? N  Dressing or bathing? N  Doing errands, shopping? N  Preparing Food and eating ? N  Using the Toilet? N  In the past six months, have you accidently leaked urine? N  Do you have problems with loss of bowel control? N  Managing your Medications? N  Managing your Finances? N  Housekeeping or managing your Housekeeping? N  Some  recent data might be hidden    Patient Care Team: Jerrol Banana., MD as PCP - General (Family Medicine) [place], [facility] Helene Shoe, Sharlet Salina, MD as Consulting Physician (General Surgery)  Indicate any recent Medical Services you may have received from other than [facility] providers in the past year (date may be approximate).     Assessment:   This is a routine wellness examination for Carlyn.  Hearing/Vision screen No exam data present  Dietary issues and exercise activities discussed: Current Exercise Habits: The patient has a physically strenuous job, but has no regular exercise apart from work.  Goals    . Quit Smoking     Recommend to continue efforts to reduce smoking habits until no longer smoking.      Depression Screen PHQ 2/9 Scores 10/17/2019 01/27/2018 04/09/2016  PHQ - 2 Score 0 0 0    Fall Risk Fall Risk  10/17/2019 10/12/2019 01/27/2018 04/09/2016 03/26/2015  Falls in the  past year? 0 0 0 No No  Number falls in past yr: 0 0 - - -  Injury with Fall? 0 0 - - -    Any stairs in or around the home? Yes  If so, are there any without handrails? No  Home free of loose throw rugs in walkways, pet beds, electrical cords, etc? Yes  Adequate lighting in your home to reduce risk of falls? Yes   ASSISTIVE DEVICES UTILIZED TO PREVENT FALLS:  Life alert? No Use of a cane, walker or w/c? No  Grab bars in the bathroom? No  Shower chair or bench in shower? Yes  Elevated toilet seat or a handicapped toilet? No    Cognitive Function: Declined at this time.        Immunizations Immunization History  Administered Date(s) Administered  . Influenza Split 10/20/2007  . Influenza,inj,Quad PF,6+ Mos 11/01/2013  . Influenza-Unspecified 12/28/2014  . Pneumococcal Conjugate-13 03/21/2014  . Pneumococcal Polysaccharide-23 03/26/2015  . Tdap 11/16/2011    TDAP status: Up to date Flu Vaccine status: Declined, Education has been provided regarding the importance of  this vaccine but patient still declined. Advised may receive this vaccine at local pharmacy or Health Dept. Aware to provide a copy of the vaccination record if obtained from local pharmacy or Health Dept. Verbalized acceptance and understanding. Pneumococcal vaccine status: Up to date Covid-19 vaccine status: Completed vaccines  Qualifies for Shingles Vaccine? Yes   Zostavax completed No   Shingrix Completed?: No.    Education has been provided regarding the importance of this vaccine. Patient has been advised to call insurance company to determine out of pocket expense if they have not yet received this vaccine. Advised may also receive vaccine at local pharmacy or Health Dept. Verbalized acceptance and understanding.  Screening Tests Health Maintenance  Topic Date Due  . COVID-19 Vaccine (1) Never done  . COLONOSCOPY  03/03/2018  . INFLUENZA VACCINE  09/03/2019  . TETANUS/TDAP  11/15/2021  . Hepatitis C Screening  Completed  . PNA vac Low Risk Adult  Completed    Health Maintenance  Health Maintenance Due  Topic Date Due  . COVID-19 Vaccine (1) Never done  . COLONOSCOPY  03/03/2018  . INFLUENZA VACCINE  09/03/2019    Colorectal cancer screening: Referral to GI placed today. Pt aware the office will call re: appt.  Lung Cancer Screening: (Low Dose CT Chest recommended if Age 63-80 years, 30 pack-year currently smoking OR have quit w/in 15years.) does qualify however declines referral at this time.   Additional Screening:  Hepatitis C Screening: Up to date  Vision Screening: Recommended annual ophthalmology exams for early detection of glaucoma and other disorders of the eye. Is the patient up to date with their annual eye exam?  Yes  Who is the provider or what is the name of the office in which the patient attends annual eye exams? [facility] If pt is not established with a provider, would they like to be referred to a provider to establish care? No .   Dental  Screening: Recommended annual dental exams for proper oral hygiene  Community Resource Referral / Chronic Care Management: CRR required this visit?  No   CCM required this visit?  No      Plan:     I have personally reviewed and noted the following in the patient's chart:   . Medical and social history . Use of alcohol, tobacco or illicit drugs  . Current medications and supplements .  Functional ability and status . Nutritional status . Physical activity . Advanced directives . List of other physicians . Hospitalizations, surgeries, and ER visits in previous 12 months . Vitals . Screenings to include cognitive, depression, and falls . Referrals and appointments  In addition, I have reviewed and discussed with patient certain preventive protocols, quality metrics, and best practice recommendations. A written personalized care plan for preventive services as well as general preventive health recommendations were provided to patient.     Lynzee Lindquist [place], [place]   [id number]/03/[id number]   Nurse Notes: Pt to receive his flu shot at work this fall. Advised pt to bring Covid vaccine card to next in office apt to update chart. Colonoscopy referral placed today.

## 2019-10-17 ENCOUNTER — Ambulatory Visit (INDEPENDENT_AMBULATORY_CARE_PROVIDER_SITE_OTHER): Payer: Medicare Other

## 2019-10-17 ENCOUNTER — Other Ambulatory Visit: Payer: Medicare Other

## 2019-10-17 ENCOUNTER — Other Ambulatory Visit: Payer: Self-pay

## 2019-10-17 DIAGNOSIS — Z1211 Encounter for screening for malignant neoplasm of colon: Secondary | ICD-10-CM | POA: Diagnosis not present

## 2019-10-17 DIAGNOSIS — Z0000 Encounter for general adult medical examination without abnormal findings: Secondary | ICD-10-CM

## 2019-10-17 NOTE — Patient Instructions (Signed)
[name] , Thank you for taking time to come for your Medicare Wellness Visit. I appreciate your ongoing commitment to your health goals. Please review the following plan we discussed and let me know if I can assist you in the future.   Screening recommendations/referrals: Colonoscopy: Referral to GI placed today. Pt aware office call to schedule apt.  Recommended yearly ophthalmology/optometry visit for glaucoma screening and checkup Recommended yearly dental visit for hygiene and checkup  Vaccinations: Influenza vaccine: Currently due Pneumococcal vaccine: Completed series Tdap vaccine: Up to date, due 11/2021 Shingles vaccine: Shingrix discussed. Please contact your pharmacy for coverage information.     Advanced directives: Please bring a copy of your POA (Power of Attorney) and/or Living Will to your next appointment.   Conditions/risks identified: Smoking cessation discussed today.   Next appointment: 02/06/20 @ 2:00 PM with Dr Rosanna Randy   Preventive Care 71 Years and Older, Male Preventive care refers to lifestyle choices and visits with your health care provider that can promote health and wellness. What does preventive care include?  A yearly physical exam. This is also called an annual well check.  Dental exams once or twice a year.  Routine eye exams. Ask your health care provider how often you should have your eyes checked.  Personal lifestyle choices, including:  Daily care of your teeth and gums.  Regular physical activity.  Eating a healthy diet.  Avoiding tobacco and drug use.  Limiting alcohol use.  Practicing safe sex.  Taking low doses of aspirin every day.  Taking vitamin and mineral supplements as recommended by your health care provider. What happens during an annual well check? The services and screenings done by your health care provider during your annual well check will depend on your age, overall health, lifestyle risk factors, and family  history of disease. Counseling  Your health care provider may ask you questions about your:  Alcohol use.  Tobacco use.  Drug use.  Emotional well-being.  Home and relationship well-being.  Sexual activity.  Eating habits.  History of falls.  Memory and ability to understand (cognition).  Work and work Statistician. Screening  You may have the following tests or measurements:  Height, weight, and BMI.  Blood pressure.  Lipid and cholesterol levels. These may be checked every 5 years, or more frequently if you are over 20 years old.  Skin check.  Lung cancer screening. You may have this screening every year starting at age 51 if you have a 30-pack-year history of smoking and currently smoke or have quit within the past 15 years.  Fecal occult blood test (FOBT) of the stool. You may have this test every year starting at age 24.  Flexible sigmoidoscopy or colonoscopy. You may have a sigmoidoscopy every 5 years or a colonoscopy every 10 years starting at age 71.  Prostate cancer screening. Recommendations will vary depending on your family history and other risks.  Hepatitis C blood test.  Hepatitis B blood test.  Sexually transmitted disease (STD) testing.  Diabetes screening. This is done by checking your blood sugar (glucose) after you have not eaten for a while (fasting). You may have this done every 1-3 years.  Abdominal aortic aneurysm (AAA) screening. You may need this if you are a current or former smoker.  Osteoporosis. You may be screened starting at age 44 if you are at high risk. Talk with your health care provider about your test results, treatment options, and if necessary, the need for more tests. Vaccines  Your health care provider may recommend certain vaccines, such as:  Influenza vaccine. This is recommended every year.  Tetanus, diphtheria, and acellular pertussis (Tdap, Td) vaccine. You may need a Td booster every 10 years.  Zoster vaccine.  You may need this after age 24.  Pneumococcal 13-valent conjugate (PCV13) vaccine. One dose is recommended after age 26.  Pneumococcal polysaccharide (PPSV23) vaccine. One dose is recommended after age 71. Talk to your health care provider about which screenings and vaccines you need and how often you need them. This information is not intended to replace advice given to you by your health care provider. Make sure you discuss any questions you have with your health care provider. Document Released: 02/15/2015 Document Revised: 10/09/2015 Document Reviewed: 11/20/2014 Elsevier Interactive Patient Education  2017 [place] Prevention in the Home Falls can cause injuries. They can happen to people of all ages. There are many things you can do to make your home safe and to help prevent falls. What can I do on the outside of my home?  Regularly fix the edges of walkways and driveways and fix any cracks.  Remove anything that might make you trip as you walk through a door, such as a raised step or threshold.  Trim any bushes or trees on the path to your home.  Use bright outdoor lighting.  Clear any walking paths of anything that might make someone trip, such as rocks or tools.  Regularly check to see if handrails are loose or broken. Make sure that both sides of any steps have handrails.  Any raised decks and porches should have guardrails on the edges.  Have any leaves, snow, or ice cleared regularly.  Use sand or salt on walking paths during winter.  Clean up any spills in your garage right away. This includes oil or grease spills. What can I do in the bathroom?  Use night lights.  Install grab bars by the toilet and in the tub and shower. Do not use towel bars as grab bars.  Use non-skid mats or decals in the tub or shower.  If you need to sit down in the shower, use a plastic, non-slip stool.  Keep the floor dry. Clean up any water that spills on the floor as soon  as it happens.  Remove soap buildup in the tub or shower regularly.  Attach bath mats securely with double-sided non-slip rug tape.  Do not have throw rugs and other things on the floor that can make you trip. What can I do in the bedroom?  Use night lights.  Make sure that you have a light by your bed that is easy to reach.  Do not use any sheets or blankets that are too big for your bed. They should not hang down onto the floor.  Have a firm chair that has side arms. You can use this for support while you get dressed.  Do not have throw rugs and other things on the floor that can make you trip. What can I do in the kitchen?  Clean up any spills right away.  Avoid walking on wet floors.  Keep items that you use a lot in easy-to-reach places.  If you need to reach something above you, use a strong step stool that has a grab bar.  Keep electrical cords out of the way.  Do not use floor polish or wax that makes floors slippery. If you must use wax, use non-skid floor wax.  Do  not have throw rugs and other things on the floor that can make you trip. What can I do with my stairs?  Do not leave any items on the stairs.  Make sure that there are handrails on both sides of the stairs and use them. Fix handrails that are broken or loose. Make sure that handrails are as long as the stairways.  Check any carpeting to make sure that it is firmly attached to the stairs. Fix any carpet that is loose or worn.  Avoid having throw rugs at the top or bottom of the stairs. If you do have throw rugs, attach them to the floor with carpet tape.  Make sure that you have a light switch at the top of the stairs and the bottom of the stairs. If you do not have them, ask someone to add them for you. What else can I do to help prevent falls?  Wear shoes that:  Do not have high heels.  Have rubber bottoms.  Are comfortable and fit you well.  Are closed at the toe. Do not wear sandals.  If  you use a stepladder:  Make sure that it is fully opened. Do not climb a closed stepladder.  Make sure that both sides of the stepladder are locked into place.  Ask someone to hold it for you, if possible.  Clearly mark and make sure that you can see:  Any grab bars or handrails.  First and last steps.  Where the edge of each step is.  Use tools that help you move around (mobility aids) if they are needed. These include:  Canes.  Walkers.  Scooters.  Crutches.  Turn on the lights when you go into a dark area. Replace any light bulbs as soon as they burn out.  Set up your furniture so you have a clear path. Avoid moving your furniture around.  If any of your floors are uneven, fix them.  If there are any pets around you, be aware of where they are.  Review your medicines with your doctor. Some medicines can make you feel dizzy. This can increase your chance of falling. Ask your doctor what other things that you can do to help prevent falls. This information is not intended to replace advice given to you by your health care provider. Make sure you discuss any questions you have with your health care provider. Document Released: 11/15/2008 Document Revised: 06/27/2015 Document Reviewed: 02/23/2014 Elsevier Interactive Patient Education  2017 [employer].

## 2019-10-18 ENCOUNTER — Other Ambulatory Visit: Payer: Medicare Other

## 2019-10-19 ENCOUNTER — Other Ambulatory Visit: Payer: Self-pay

## 2019-10-19 ENCOUNTER — Telehealth (INDEPENDENT_AMBULATORY_CARE_PROVIDER_SITE_OTHER): Payer: Self-pay | Admitting: Gastroenterology

## 2019-10-19 DIAGNOSIS — Z1211 Encounter for screening for malignant neoplasm of colon: Secondary | ICD-10-CM

## 2019-10-19 DIAGNOSIS — Z86010 Personal history of colonic polyps: Secondary | ICD-10-CM

## 2019-10-19 MED ORDER — NA SULFATE-K SULFATE-MG SULF 17.5-3.13-1.6 GM/177ML PO SOLN: 1.0000 | Freq: Once | ORAL | 0 refills | Status: AC

## 2019-10-19 NOTE — Progress Notes (Signed)
Gastroenterology Pre-Procedure Review  Request Date: Monday 12/11/19 Requesting Physician: Dr. Vicente Males  PATIENT REVIEW QUESTIONS: The patient responded to the following health history questions as indicated:    1. Are you having any GI issues? no 2. Do you have a personal history of Polyps? yes (03/03/13 Dr. Rayann Heman noted 2 polyps) 3. Do you have a family history of Colon Cancer or Polyps? no 4. Diabetes Mellitus? no 5. Joint replacements in the past 12 months?no 6. Major health problems in the past 3 months?no 7. Any artificial heart valves, MVP, or defibrillator?no    MEDICATIONS & ALLERGIES:    Patient reports the following regarding taking any anticoagulation/antiplatelet therapy:   Plavix, Coumadin, Eliquis, Xarelto, Lovenox, Pradaxa, Brilinta, or Effient? no Aspirin? no  Patient confirms/reports the following medications:  Current Outpatient Medications  Medication Sig Dispense Refill   fluticasone (FLOVENT HFA) 110 MCG/ACT inhaler Inhale 2 puffs into the lungs daily. 12 g 6   levothyroxine (SYNTHROID) 150 MCG tablet Take 1 tablet (150 mcg total) by mouth daily. 90 tablet 3   Multiple Vitamins-Minerals (PRESERVISION AREDS 2 PO) Take by mouth.     pravastatin (PRAVACHOL) 40 MG tablet Take 1 tablet (40 mg total) by mouth daily. 90 tablet 3   traZODone (DESYREL) 100 MG tablet Take 1 tablet (100 mg total) by mouth at bedtime. 90 tablet 3   Fluticasone-Umeclidin-Vilant (TRELEGY ELLIPTA) 100-62.5-25 MCG/INH AEPB Inhale 1 Inhaler into the lungs daily. (Patient not taking: Reported on 09/28/2019) 90 each 3   Na Sulfate-K Sulfate-Mg Sulf 17.5-3.13-1.6 GM/177ML SOLN Take 1 kit by mouth once for 1 dose. 354 mL 0   No current facility-administered medications for this visit.    Patient confirms/reports the following allergies:  No Known Allergies  Orders Placed This Encounter  Procedures   Procedural/ Surgical Case Request: COLONOSCOPY WITH PROPOFOL    Standing Status:   Standing     Number of Occurrences:   1    Order Specific Question:   Pre-op diagnosis    Answer:   screening colonoscopy, personal history of colon polyps    Order Specific Question:   CPT Code    Answer:   29562    AUTHORIZATION INFORMATION Primary Insurance: 1D#: Group #:  Secondary Insurance: 1D#: Group #:  SCHEDULE INFORMATION: Date: 12/11/19 Time: Location:[facility]

## 2019-11-01 ENCOUNTER — Other Ambulatory Visit: Payer: Self-pay | Admitting: Family Medicine

## 2019-11-01 DIAGNOSIS — E039 Hypothyroidism, unspecified: Secondary | ICD-10-CM

## 2019-11-01 DIAGNOSIS — G4700 Insomnia, unspecified: Secondary | ICD-10-CM

## 2019-11-01 DIAGNOSIS — Z23 Encounter for immunization: Secondary | ICD-10-CM | POA: Diagnosis not present

## 2019-11-01 NOTE — Telephone Encounter (Signed)
Requested Prescriptions  Pending Prescriptions Disp Refills  . traZODone (DESYREL) 100 MG tablet [Pharmacy Med Name: TRAZODONE TAB 100MG ] 90 tablet 3    Sig: TAKE 1 TABLET AT BEDTIME     Psychiatry: Antidepressants - Serotonin Modulator Passed - 11/01/2019  7:30 AM      Passed - Valid encounter within last 6 months    Recent Outpatient Visits          1 month ago COPD exacerbation [facility])   [facility] Jerrol Banana., MD   1 year ago COPD exacerbation [facility])   [facility] Jerrol Banana[facility] MD   1 year ago COPD exacerbation [facility])   [facility] Jerrol Banana., MD   1 year ago COPD exacerbation [facility])   [facility] Jerrol Banana., MD   1 year ago Acute bronchitis with COPD [facility])   [place], Vickki Muff, [place]

## 2019-11-03 ENCOUNTER — Encounter
Admission: RE | Admit: 2019-11-03 | Discharge: 2019-11-03 | Disposition: A | Discharge status: Home / Self Care | Payer: Medicare Other | Source: Ambulatory Visit | Attending: Surgery | Admitting: Surgery

## 2019-11-03 ENCOUNTER — Other Ambulatory Visit: Payer: Self-pay

## 2019-11-03 HISTORY — DX: Chronic obstructive pulmonary disease, unspecified: J44.9

## 2019-11-03 NOTE — Patient Instructions (Signed)
Your procedure is scheduled on: Friday November 10, 2019. Report to Day Surgery inside [facility] 2nd floor. To find out your arrival time please call [phone] between 1PM - 3PM on Thursday November 09, 2019.  Remember: Instructions that are not followed completely may result in serious medical risk,  up to and including death, or upon the discretion of your surgeon and anesthesiologist your  surgery may need to be rescheduled.     _X__ 1. Do not eat food after midnight the night before your procedure.                 No chewing gum or hard candies. You may drink clear liquids up to 2 hours                 before you are scheduled to arrive for your surgery- DO not drink clear                 liquids within 2 hours of the start of your surgery.                 Clear Liquids include:  water, apple juice without pulp, clear Gatorade, G2 or                  Gatorade Zero (avoid Red/Purple/Blue), Black Coffee or Tea (Do not add                 anything to coffee or tea).  __X__2.  On the morning of surgery brush your teeth with toothpaste and water, you                may rinse your mouth with mouthwash if you wish.  Do not swallow any toothpaste of mouthwash.     _X__ 3.  No Alcohol for 24 hours before or after surgery.   _X__ 4.  Do Not Smoke or use e-cigarettes For 24 Hours Prior to Your Surgery.                 Do not use any chewable tobacco products for at least 6 hours prior to                 Surgery.  _X__  5.  Do not use any recreational drugs (marijuana, cocaine, heroin, ecstasy, MDMA or other)                For at least one week prior to your surgery.  Combination of these drugs with anesthesia                May have life threatening results.   __x__ 6.  Notify your doctor if there is any change in your medical condition      (cold, fever, infections).     Do not wear jewelry, make-up, hairpins, clips or nail polish. Do not wear lotions, powders,  or perfumes. You may wear deodorant. Do not shave 48 hours prior to surgery. Men may shave face and neck. Do not bring valuables to the hospital.    [facility] is not responsible for any belongings or valuables.  Contacts, dentures or bridgework may not be worn into surgery. Leave your suitcase in the car. After surgery it may be brought to your room. For patients admitted to the hospital, discharge time is determined by your treatment team.   Patients discharged the day of surgery will not be allowed to drive home.   Make arrangements for someone to  be with you for the first 24 hours of your Same Day Discharge.   __x__ Take these medicines the morning of surgery with A SIP OF WATER:    1. levothyroxine (SYNTHROID) 150 MCG    ____ Use CHG Soap (or wipes) as directed  __x__ Use Benzoyl Peroxide Gel as instructed  __x__ Use inhalers on the day of surgery  fluticasone (FLOVENT HFA) 110 MCG/ACT inhaler   __x__ Stop Anti-inflammatories such as Ibuprofen, Aleve, Advil, naproxen, aspirin and or BC powders.    __x__ Stop supplements until after surgery. Ascorbic Acid (VITAMIN C) ok to continue with Vitamin D  __x__ Do not start any herbal supplements before your procedure.     If you have any questions regarding your pre-procedure instructions,  Please call Pre-admit Testing at [phone].

## 2019-11-08 ENCOUNTER — Other Ambulatory Visit: Payer: Self-pay

## 2019-11-08 ENCOUNTER — Encounter
Admission: RE | Admit: 2019-11-08 | Discharge: 2019-11-08 | Disposition: A | Discharge status: Home / Self Care | Payer: Medicare Other | Source: Ambulatory Visit | Attending: Surgery | Admitting: Surgery

## 2019-11-08 DIAGNOSIS — Z01810 Encounter for preprocedural cardiovascular examination: Secondary | ICD-10-CM | POA: Insufficient documentation

## 2019-11-08 DIAGNOSIS — Z20822 Contact with and (suspected) exposure to covid-19: Secondary | ICD-10-CM | POA: Diagnosis not present

## 2019-11-08 DIAGNOSIS — F17200 Nicotine dependence, unspecified, uncomplicated: Secondary | ICD-10-CM | POA: Diagnosis not present

## 2019-11-08 DIAGNOSIS — Z01812 Encounter for preprocedural laboratory examination: Secondary | ICD-10-CM | POA: Diagnosis not present

## 2019-11-08 LAB — CBC WITH DIFFERENTIAL/PLATELET
Abs Immature Granulocytes: 0.05 10*3/uL (ref 0.00–0.07)
Basophils Absolute: 0.1 10*3/uL (ref 0.0–0.1)
Basophils Relative: 1 %
Eosinophils Absolute: 0.3 10*3/uL (ref 0.0–0.5)
Eosinophils Relative: 5 %
HCT: 44.9 % (ref 39.0–52.0)
Hemoglobin: 15.3 g/dL (ref 13.0–17.0)
Immature Granulocytes: 1 %
Lymphocytes Relative: 31 %
Lymphs Abs: 2.1 10*3/uL (ref 0.7–4.0)
MCH: 32.1 pg (ref 26.0–34.0)
MCHC: 34.1 g/dL (ref 30.0–36.0)
MCV: 94.1 fL (ref 80.0–100.0)
Monocytes Absolute: 0.4 10*3/uL (ref 0.1–1.0)
Monocytes Relative: 6 %
Neutro Abs: 3.8 10*3/uL (ref 1.7–7.7)
Neutrophils Relative %: 56 %
Platelets: 111 10*3/uL — ABNORMAL LOW (ref 150–400)
RBC: 4.77 MIL/uL (ref 4.22–5.81)
RDW: 14.3 % (ref 11.5–15.5)
WBC: 6.7 10*3/uL (ref 4.0–10.5)
nRBC: 0 % (ref 0.0–0.2)

## 2019-11-08 LAB — SARS CORONAVIRUS 2 (TAT 6-24 HRS): SARS Coronavirus 2: NEGATIVE

## 2019-11-08 LAB — COMPREHENSIVE METABOLIC PANEL
ALT: 23 U/L (ref 0–44)
AST: 19 U/L (ref 15–41)
Albumin: 4.1 g/dL (ref 3.5–5.0)
Alkaline Phosphatase: 85 U/L (ref 38–126)
Anion gap: 8 (ref 5–15)
BUN: 20 mg/dL (ref 8–23)
CO2: 28 mmol/L (ref 22–32)
Calcium: 9.3 mg/dL (ref 8.9–10.3)
Chloride: 102 mmol/L (ref 98–111)
Creatinine, Ser: 1.17 mg/dL (ref 0.61–1.24)
GFR calc non Af Amer: >60 mL/min (ref >60)
Glucose, Bld: 94 mg/dL (ref 70–99)
Potassium: 4.6 mmol/L (ref 3.5–5.1)
Sodium: 138 mmol/L (ref 135–145)
Total Bilirubin: 0.7 mg/dL (ref 0.3–1.2)
Total Protein: 7 g/dL (ref 6.5–8.1)

## 2019-11-10 ENCOUNTER — Ambulatory Visit
Admission: RE | Admit: 2019-11-10 | Discharge: 2019-11-10 | Disposition: A | Discharge status: Home / Self Care | Payer: Medicare Other | Attending: Surgery | Admitting: Surgery

## 2019-11-10 ENCOUNTER — Encounter: Payer: Self-pay | Admitting: Surgery

## 2019-11-10 ENCOUNTER — Other Ambulatory Visit: Payer: Self-pay

## 2019-11-10 ENCOUNTER — Ambulatory Visit: Payer: Medicare Other | Admitting: Certified Registered"

## 2019-11-10 ENCOUNTER — Encounter
Admission: RE | Disposition: A | Discharge status: Home / Self Care | Payer: Self-pay | Source: Home / Self Care | Attending: Surgery

## 2019-11-10 DIAGNOSIS — Z7951 Long term (current) use of inhaled steroids: Secondary | ICD-10-CM | POA: Diagnosis not present

## 2019-11-10 DIAGNOSIS — E039 Hypothyroidism, unspecified: Secondary | ICD-10-CM | POA: Insufficient documentation

## 2019-11-10 DIAGNOSIS — F17210 Nicotine dependence, cigarettes, uncomplicated: Secondary | ICD-10-CM | POA: Diagnosis not present

## 2019-11-10 DIAGNOSIS — J449 Chronic obstructive pulmonary disease, unspecified: Secondary | ICD-10-CM | POA: Insufficient documentation

## 2019-11-10 DIAGNOSIS — K4090 Unilateral inguinal hernia, without obstruction or gangrene, not specified as recurrent: Secondary | ICD-10-CM | POA: Insufficient documentation

## 2019-11-10 DIAGNOSIS — Z79899 Other long term (current) drug therapy: Secondary | ICD-10-CM | POA: Insufficient documentation

## 2019-11-10 DIAGNOSIS — Z79890 Hormone replacement therapy (postmenopausal): Secondary | ICD-10-CM | POA: Diagnosis not present

## 2019-11-10 HISTORY — PX: XI ROBOTIC ASSISTED INGUINAL HERNIA REPAIR WITH MESH: SHX6706

## 2019-11-10 SURGERY — REPAIR, HERNIA, INGUINAL, ROBOT-ASSISTED, LAPAROSCOPIC, USING MESH
Anesthesia: General | Laterality: Left

## 2019-11-10 MED ORDER — PROPOFOL 10 MG/ML IV BOLUS
INTRAVENOUS | Status: DC | PRN
  Administered 2019-11-10: 100 mg via INTRAVENOUS

## 2019-11-10 MED ORDER — GABAPENTIN 300 MG PO CAPS
ORAL_CAPSULE | ORAL | Status: AC
  Administered 2019-11-10: 300 mg via ORAL
  Filled 2019-11-10: qty 1

## 2019-11-10 MED ORDER — EPHEDRINE 5 MG/ML INJ
INTRAVENOUS | Status: AC
  Filled 2019-11-10: qty 10

## 2019-11-10 MED ORDER — BUPIVACAINE-EPINEPHRINE (PF) 0.25% -1:200000 IJ SOLN
INTRAMUSCULAR | Status: AC
  Filled 2019-11-10: qty 30

## 2019-11-10 MED ORDER — SODIUM CHLORIDE (PF) 0.9 % IJ SOLN
INTRAMUSCULAR | Status: AC
  Filled 2019-11-10: qty 50

## 2019-11-10 MED ORDER — CELECOXIB 200 MG PO CAPS
ORAL_CAPSULE | ORAL | Status: AC
  Administered 2019-11-10: 200 mg via ORAL
  Filled 2019-11-10: qty 1

## 2019-11-10 MED ORDER — CHLORHEXIDINE GLUCONATE CLOTH 2 % EX PADS
6.0000 | MEDICATED_PAD | Freq: Once | CUTANEOUS | Status: AC
  Administered 2019-11-10: 6 via TOPICAL

## 2019-11-10 MED ORDER — OXYCODONE HCL 5 MG/5ML PO SOLN: 5.0000 mg | Freq: Once | ORAL | Status: AC | PRN

## 2019-11-10 MED ORDER — LIDOCAINE HCL (CARDIAC) PF 100 MG/5ML IV SOSY
PREFILLED_SYRINGE | INTRAVENOUS | Status: DC | PRN
  Administered 2019-11-10: 60 mg via INTRAVENOUS

## 2019-11-10 MED ORDER — ONDANSETRON HCL 4 MG/2ML IJ SOLN
INTRAMUSCULAR | Status: DC | PRN
  Administered 2019-11-10: 4 mg via INTRAVENOUS

## 2019-11-10 MED ORDER — SUCCINYLCHOLINE CHLORIDE 200 MG/10ML IV SOSY
PREFILLED_SYRINGE | INTRAVENOUS | Status: AC
  Filled 2019-11-10: qty 10

## 2019-11-10 MED ORDER — OXYCODONE HCL 5 MG PO TABS
ORAL_TABLET | ORAL | Status: AC
  Administered 2019-11-10: 5 mg via ORAL
  Filled 2019-11-10: qty 1

## 2019-11-10 MED ORDER — BUPIVACAINE LIPOSOME 1.3 % IJ SUSP: 20.0000 mL | Freq: Once | INTRAMUSCULAR | Status: DC

## 2019-11-10 MED ORDER — LACTATED RINGERS IV SOLN: INTRAVENOUS | Status: DC

## 2019-11-10 MED ORDER — BUPIVACAINE LIPOSOME 1.3 % IJ SUSP
INTRAMUSCULAR | Status: AC
  Filled 2019-11-10: qty 20

## 2019-11-10 MED ORDER — GLYCOPYRROLATE 0.2 MG/ML IJ SOLN
INTRAMUSCULAR | Status: DC | PRN
  Administered 2019-11-10: .2 mg via INTRAVENOUS

## 2019-11-10 MED ORDER — CHLORHEXIDINE GLUCONATE 0.12 % MT SOLN: 15.0000 mL | Freq: Once | OROMUCOSAL | Status: AC

## 2019-11-10 MED ORDER — ACETAMINOPHEN 500 MG PO TABS
ORAL_TABLET | ORAL | Status: AC
  Administered 2019-11-10: 1000 mg via ORAL
  Filled 2019-11-10: qty 2

## 2019-11-10 MED ORDER — EPHEDRINE SULFATE 50 MG/ML IJ SOLN
INTRAMUSCULAR | Status: DC | PRN
  Administered 2019-11-10: 5 mg via INTRAVENOUS
  Administered 2019-11-10: 25 mg via INTRAVENOUS

## 2019-11-10 MED ORDER — PROMETHAZINE HCL 25 MG/ML IJ SOLN: 6.2500 mg | INTRAMUSCULAR | Status: DC | PRN

## 2019-11-10 MED ORDER — ONDANSETRON HCL 4 MG/2ML IJ SOLN
INTRAMUSCULAR | Status: AC
  Filled 2019-11-10: qty 2

## 2019-11-10 MED ORDER — IBUPROFEN 800 MG PO TABS: 800.0000 mg | ORAL_TABLET | Freq: Three times a day (TID) | ORAL | 0 refills | Status: DC | PRN

## 2019-11-10 MED ORDER — FENTANYL CITRATE (PF) 100 MCG/2ML IJ SOLN
INTRAMUSCULAR | Status: AC
  Filled 2019-11-10: qty 2

## 2019-11-10 MED ORDER — PHENYLEPHRINE HCL (PRESSORS) 10 MG/ML IV SOLN
INTRAVENOUS | Status: AC
  Filled 2019-11-10: qty 1

## 2019-11-10 MED ORDER — ORAL CARE MOUTH RINSE: 15.0000 mL | Freq: Once | OROMUCOSAL | Status: AC

## 2019-11-10 MED ORDER — BUPIVACAINE-EPINEPHRINE (PF) 0.25% -1:200000 IJ SOLN
INTRAMUSCULAR | Status: DC | PRN
  Administered 2019-11-10: 20 mL

## 2019-11-10 MED ORDER — CEFAZOLIN SODIUM-DEXTROSE 2-4 GM/100ML-% IV SOLN
2.0000 g | INTRAVENOUS | Status: AC
  Administered 2019-11-10: 2 g via INTRAVENOUS

## 2019-11-10 MED ORDER — CELECOXIB 200 MG PO CAPS: 200.0000 mg | ORAL_CAPSULE | ORAL | Status: AC

## 2019-11-10 MED ORDER — ROCURONIUM BROMIDE 100 MG/10ML IV SOLN
INTRAVENOUS | Status: DC | PRN
  Administered 2019-11-10: 10 mg via INTRAVENOUS
  Administered 2019-11-10: 60 mg via INTRAVENOUS

## 2019-11-10 MED ORDER — FENTANYL CITRATE (PF) 100 MCG/2ML IJ SOLN
INTRAMUSCULAR | Status: DC | PRN
Start: 2019-11-10 — End: 2019-11-10
  Administered 2019-11-10: 100 ug via INTRAVENOUS

## 2019-11-10 MED ORDER — PROPOFOL 10 MG/ML IV BOLUS
INTRAVENOUS | Status: AC
  Filled 2019-11-10: qty 20

## 2019-11-10 MED ORDER — FAMOTIDINE 20 MG PO TABS: 20.0000 mg | ORAL_TABLET | Freq: Once | ORAL | Status: AC

## 2019-11-10 MED ORDER — SUCCINYLCHOLINE CHLORIDE 20 MG/ML IJ SOLN
INTRAMUSCULAR | Status: DC | PRN
  Administered 2019-11-10: 80 mg via INTRAVENOUS

## 2019-11-10 MED ORDER — FENTANYL CITRATE (PF) 100 MCG/2ML IJ SOLN
25.0000 ug | INTRAMUSCULAR | Status: DC | PRN
  Administered 2019-11-10: 25 ug via INTRAVENOUS

## 2019-11-10 MED ORDER — SUGAMMADEX SODIUM 200 MG/2ML IV SOLN
INTRAVENOUS | Status: DC | PRN
  Administered 2019-11-10: 200 mg via INTRAVENOUS

## 2019-11-10 MED ORDER — DEXAMETHASONE SODIUM PHOSPHATE 10 MG/ML IJ SOLN
INTRAMUSCULAR | Status: AC
  Filled 2019-11-10: qty 1

## 2019-11-10 MED ORDER — GABAPENTIN 300 MG PO CAPS: 300.0000 mg | ORAL_CAPSULE | ORAL | Status: AC

## 2019-11-10 MED ORDER — ACETAMINOPHEN 500 MG PO TABS: 1000.0000 mg | ORAL_TABLET | ORAL | Status: AC

## 2019-11-10 MED ORDER — FAMOTIDINE 20 MG PO TABS
ORAL_TABLET | ORAL | Status: AC
  Administered 2019-11-10: 20 mg via ORAL
  Filled 2019-11-10: qty 1

## 2019-11-10 MED ORDER — CEFAZOLIN SODIUM-DEXTROSE 2-4 GM/100ML-% IV SOLN
INTRAVENOUS | Status: AC
  Filled 2019-11-10: qty 100

## 2019-11-10 MED ORDER — DEXAMETHASONE SODIUM PHOSPHATE 10 MG/ML IJ SOLN
INTRAMUSCULAR | Status: DC | PRN
  Administered 2019-11-10: 10 mg via INTRAVENOUS

## 2019-11-10 MED ORDER — MEPERIDINE HCL 50 MG/ML IJ SOLN: 6.2500 mg | INTRAMUSCULAR | Status: DC | PRN

## 2019-11-10 MED ORDER — SODIUM CHLORIDE (PF) 0.9 % IJ SOLN
INTRAMUSCULAR | Status: AC
  Filled 2019-11-10: qty 10

## 2019-11-10 MED ORDER — LIDOCAINE HCL (PF) 2 % IJ SOLN
INTRAMUSCULAR | Status: AC
  Filled 2019-11-10: qty 5

## 2019-11-10 MED ORDER — OXYCODONE HCL 5 MG PO TABS: 5.0000 mg | ORAL_TABLET | Freq: Once | ORAL | Status: AC | PRN

## 2019-11-10 MED ORDER — CHLORHEXIDINE GLUCONATE 0.12 % MT SOLN
OROMUCOSAL | Status: AC
  Administered 2019-11-10: 15 mL via OROMUCOSAL
  Filled 2019-11-10: qty 15

## 2019-11-10 MED ORDER — ROCURONIUM BROMIDE 10 MG/ML (PF) SYRINGE
PREFILLED_SYRINGE | INTRAVENOUS | Status: AC
  Filled 2019-11-10: qty 10

## 2019-11-10 MED ORDER — HYDROCODONE-ACETAMINOPHEN 5-325 MG PO TABS: 1.0000 | ORAL_TABLET | Freq: Four times a day (QID) | ORAL | 0 refills | Status: DC | PRN

## 2019-11-10 MED ORDER — GLYCOPYRROLATE 0.2 MG/ML IJ SOLN
INTRAMUSCULAR | Status: AC
  Filled 2019-11-10: qty 1

## 2019-11-10 SURGICAL SUPPLY — 45 items
ADH SKN CLS APL DERMABOND .7 (GAUZE/BANDAGES/DRESSINGS) ×1
APL PRP STRL LF DISP 70% ISPRP (MISCELLANEOUS) ×1
BLADE CLIPPER SURG (BLADE) ×3 IMPLANT
CANISTER SUCT 1200ML W/VALVE (MISCELLANEOUS) ×3 IMPLANT
CHLORAPREP W/TINT 26 (MISCELLANEOUS) ×3 IMPLANT
COVER TIP SHEARS 8 DVNC (MISCELLANEOUS) ×1 IMPLANT
COVER TIP SHEARS 8MM DA VINCI (MISCELLANEOUS) ×3
COVER WAND RF STERILE (DRAPES) ×6 IMPLANT
DEFOGGER SCOPE WARMER CLEARIFY (MISCELLANEOUS) ×3 IMPLANT
DERMABOND ADVANCED (GAUZE/BANDAGES/DRESSINGS) ×2
DERMABOND ADVANCED .7 DNX12 (GAUZE/BANDAGES/DRESSINGS) ×1 IMPLANT
DRAPE 3/4 80X56 (DRAPES) ×3 IMPLANT
DRAPE ARM DVNC X/XI (DISPOSABLE) ×3 IMPLANT
DRAPE COLUMN DVNC XI (DISPOSABLE) ×1 IMPLANT
DRAPE DA VINCI XI ARM (DISPOSABLE) ×9
DRAPE DA VINCI XI COLUMN (DISPOSABLE) ×3
ELECT REM PT RETURN 9FT ADLT (ELECTROSURGICAL) ×3
ELECTRODE REM PT RTRN 9FT ADLT (ELECTROSURGICAL) ×1 IMPLANT
GLOVE ORTHO TXT STRL SZ7.5 (GLOVE) ×9 IMPLANT
GOWN STRL REUS W/ TWL LRG LVL3 (GOWN DISPOSABLE) ×3 IMPLANT
GOWN STRL REUS W/TWL LRG LVL3 (GOWN DISPOSABLE) ×9
GRASPER SUT TROCAR 14GX15 (MISCELLANEOUS) IMPLANT
IRRIGATION STRYKERFLOW (MISCELLANEOUS) IMPLANT
IRRIGATOR STRYKERFLOW (MISCELLANEOUS)
IV CATH ANGIO 14GX1.88 NO SAFE (IV SOLUTION) ×3 IMPLANT
IV NS 1000ML (IV SOLUTION)
IV NS 1000ML BAXH (IV SOLUTION) IMPLANT
KIT PINK PAD W/HEAD ARE REST (MISCELLANEOUS) ×3
KIT PINK PAD W/HEAD ARM REST (MISCELLANEOUS) ×1 IMPLANT
LABEL OR SOLS (LABEL) ×3 IMPLANT
MESH 3DMAX LIGHT 4.1X6.2 LT LR (Mesh General) ×2 IMPLANT
NDL INSUFFLATION 14GA 120MM (NEEDLE) IMPLANT
NEEDLE HYPO 22GX1.5 SAFETY (NEEDLE) ×3 IMPLANT
NEEDLE INSUFFLATION 14GA 120MM (NEEDLE) IMPLANT
PACK LAP CHOLECYSTECTOMY (MISCELLANEOUS) ×3 IMPLANT
SEAL CANN UNIV 5-8 DVNC XI (MISCELLANEOUS) ×3 IMPLANT
SEAL XI 5MM-8MM UNIVERSAL (MISCELLANEOUS) ×9
SET TUBE SMOKE EVAC HIGH FLOW (TUBING) ×3 IMPLANT
SOLUTION ELECTROLUBE (MISCELLANEOUS) ×3 IMPLANT
SUT MNCRL 4-0 (SUTURE) ×3
SUT MNCRL 4-0 27XMFL (SUTURE) ×1
SUT VIC AB 0 CT2 27 (SUTURE) ×3 IMPLANT
SUT VLOC 90 S/L VL9 GS22 (SUTURE) ×3 IMPLANT
SUTURE MNCRL 4-0 27XMF (SUTURE) ×1 IMPLANT
TROCAR Z-THREAD FIOS 11X100 BL (TROCAR) ×3 IMPLANT

## 2019-11-10 NOTE — Discharge Instructions (Signed)

## 2019-11-10 NOTE — Anesthesia Preprocedure Evaluation (Signed)
Anesthesia Evaluation  Patient identified by MRN, date of birth, ID band Patient awake    Reviewed: Allergy & Precautions, NPO status , Patient's Chart, lab work & pertinent test results  History of Anesthesia Complications Negative for: history of anesthetic complications  Airway Mallampati: II  TM Distance: >3 FB Neck ROM: Full    Dental  (+) Poor Dentition   Pulmonary COPD,  COPD inhaler, Current Smoker and Patient abstained from smoking.,    breath sounds clear to auscultation- rhonchi (-) wheezing      Cardiovascular (-) hypertension(-) CAD, (-) Past MI, (-) Cardiac Stents and (-) CABG  Rhythm:Regular Rate:Normal - Systolic murmurs and - Diastolic murmurs    Neuro/Psych neg Seizures negative neurological ROS  negative psych ROS   GI/Hepatic Neg liver ROS, GERD  ,  Endo/Other  neg diabetesHypothyroidism   Renal/GU negative Renal ROS     Musculoskeletal negative musculoskeletal ROS (+)   Abdominal (+) - obese,   Peds  Hematology negative hematology ROS (+)   Anesthesia Other Findings Past Medical History: No date: COPD (chronic obstructive pulmonary disease) (HCC) No date: Hernia of abdominal wall No date: Hyperlipidemia No date: Hypothyroid No date: Insomnia   Reproductive/Obstetrics                             Anesthesia Physical Anesthesia Plan  ASA: II  Anesthesia Plan: General   Post-op Pain Management:    Induction: Intravenous  PONV Risk Score and Plan: 0 and Ondansetron  Airway Management Planned: Oral ETT  Additional Equipment:   Intra-op Plan:   Post-operative Plan: Extubation in OR  Informed Consent: I have reviewed the patients History and Physical, chart, labs and discussed the procedure including the risks, benefits and alternatives for the proposed anesthesia with the patient or authorized representative who has indicated his/her understanding and  acceptance.     Dental advisory given  Plan Discussed with: CRNA and Anesthesiologist  Anesthesia Plan Comments:         Anesthesia Quick Evaluation

## 2019-11-10 NOTE — Anesthesia Postprocedure Evaluation (Signed)
Anesthesia Post Note  Patient: Hunter Oconnor  Procedure(s) Performed: XI ROBOTIC ASSISTED INGUINAL HERNIA REPAIR WITH MESH (Left )  Patient location during evaluation: PACU Anesthesia Type: General Level of consciousness: awake and alert and oriented Pain management: pain level controlled Vital Signs Assessment: post-procedure vital signs reviewed and stable Respiratory status: spontaneous breathing, nonlabored ventilation and respiratory function stable Cardiovascular status: blood pressure returned to baseline and stable Postop Assessment: no signs of nausea or vomiting Anesthetic complications: no   No complications documented.   Last Vitals:  Vitals:   11/10/19 1029 11/10/19 1147  BP: 135/88 (!) 157/72  Pulse: (!) 58 (!) 46  Resp: 16 16  Temp: (!) 36 C   SpO2: 97% 99%    Last Pain:  Vitals:   11/10/19 1147  TempSrc:   PainSc: 2                  Esra Frankowski

## 2019-11-10 NOTE — Anesthesia Procedure Notes (Signed)
Procedure Name: Intubation Performed by: Gaynelle Cage, CRNA Pre-anesthesia Checklist: Patient identified, Emergency Drugs available, Suction available and Patient being monitored Patient Re-evaluated:Patient Re-evaluated prior to induction Oxygen Delivery Method: Circle system utilized Preoxygenation: Pre-oxygenation with 100% oxygen Induction Type: IV induction Ventilation: Mask ventilation without difficulty and Oral airway inserted - appropriate to patient size Laryngoscope Size: McGraph and 3 Grade View: Grade I Tube type: Oral Tube size: 7.5 mm Number of attempts: 1 Airway Equipment and Method: Stylet and Oral airway Placement Confirmation: ETT inserted through vocal cords under direct vision,  positive ETCO2 and breath sounds checked- equal and bilateral Secured at: 23 cm Tube secured with: Tape Dental Injury: Teeth and Oropharynx as per pre-operative assessment

## 2019-11-10 NOTE — Transfer of Care (Signed)
Immediate Anesthesia Transfer of Care Note  Patient: [name]  Procedure(s) Performed: XI ROBOTIC ASSISTED INGUINAL HERNIA REPAIR WITH MESH (Left )  Patient Location: PACU  Anesthesia Type:General  Level of Consciousness: sedated  Airway & Oxygen Therapy: Patient Spontanous Breathing and Patient connected to face mask oxygen  Post-op Assessment: Report given to RN and Post -op Vital signs reviewed and stable  Post vital signs: Reviewed and stable  Last Vitals:  Vitals Value Taken Time  BP 134/65 11/10/19 0920  Temp    Pulse 72 11/10/19 0921  Resp 25 11/10/19 0921  SpO2 100 % 11/10/19 0921  Vitals shown include unvalidated device data.  Last Pain:  Vitals:   11/10/19 0618  TempSrc: Tympanic  PainSc: 0-No pain         Complications: No complications documented.

## 2019-11-10 NOTE — Op Note (Signed)
Robotic assisted Laparoscopic Transabdominal  left inguinal Hernia Repair with Mesh       Pre-operative Diagnosis:   Left inguinal Hernia   Post-operative Diagnosis: Same   Procedure: Robotic assisted Laparoscopic  repair of left inguinal hernia(s)   Surgeon: Ronny Bacon, M.D., FACS   Anesthesia: GETA   Findings:  Left inguinal hernia,  evidence of previously repaired right sided hernia.         Procedure Details  The patient was seen again in the Holding Room. The benefits, complications, treatment options, and expected outcomes were discussed with the patient. The risks of bleeding, infection, recurrence of symptoms, failure to resolve symptoms, recurrence of hernia, ischemic orchitis, chronic pain syndrome or neuroma, were reviewed again. The likelihood of improving the patient's symptoms with return to their baseline status is good.  The patient and/or family concurred with the proposed plan, giving informed consent.  The patient was taken to Operating Room, identified  and the procedure verified as Laparoscopic Inguinal Hernia Repair. Laterality confirmed.  A Time Out was held and the above information confirmed.   Prior to the induction of general anesthesia, antibiotic prophylaxis was administered. VTE prophylaxis was in place. General endotracheal anesthesia was then administered and tolerated well. After the induction, the abdomen was prepped with Chloraprep and draped in the sterile fashion. The patient was positioned in the supine position.   After local infiltration of quarter percent Marcaine with epinephrine, stab incision was made left upper quadrant.  Just below the costal margin approximately midclavicular line the Veress needle is passed with sensation of the layers to penetrate the abdominal wall and into the peritoneum.  Saline drop test is confirmed peritoneal placement.  Insufflation is initiated with carbon dioxide to pressures of 15 mmHg. An 8.5 mm port is placed to  the left off of the midline, with blunt tipped trocar.  Pneumoperitoneum maintained w/o HD changes. No evidence of bowel injuries.  Two 8.5 mm ports placed under direct vision. The laparoscopy revealed large left indirect defect.   The robot was brought ot the table and docked in the standard fashion, no collision between arms was observed. Instruments were kept under direct view at all times. For left inguinal hernia repair,  I developed a peritoneal flap. The sac(s) were reduced and dissected free from adjacent structures. We preserved the vas and the vessels, and visualized them to their convergence and beyond in the retroperitoneum. Once dissection was completed a large left sided BARD 3D Light mesh was placed and secured at three points with interrupted 2-0 Vicryl to the pubic tubercle and anteriorly. There was good coverage of the direct, indirect and femoral spaces. A large angiocath is placed under direct visualization in the groin to reduce trapped extraperitoneal air and confirm adequate peritoneal closure.  Second look revealed no complications or injuries.  The flap was then closed with 2-0 V-lock suture.  Peritoneal closure without defects.  Once assuring that hemostasis was adequate, all needles/sponges removed, and the robot was undocked.  The ports were removed, the abdomen desulflated.  4-0 subcuticular Monocryl was used at all skin edges. Dermabond was placed.  Patient tolerated the procedure well. There were no complications. He was taken to the recovery room in stable condition.           Ronny Bacon, M.D., FACS 11/10/2019, 9:17 AM

## 2019-11-10 NOTE — Interval H&P Note (Signed)
History and Physical Interval Note:  11/10/2019 7:33 AM  Hunter Oconnor  has presented today for surgery, with the diagnosis of Left inguinal hernia.  The various methods of treatment have been discussed with the patient and family. After consideration of risks, benefits and other options for treatment, the patient has consented to  Procedure(s): XI ROBOTIC [place] (Left) as a surgical intervention.  The patient's history has been reviewed, patient examined, no change in status, stable for surgery.  I have reviewed the patient's chart and labs.  Questions were answered to the patient's satisfaction.   The left side is marked for surgery.   Ronny Bacon

## 2019-11-23 ENCOUNTER — Ambulatory Visit (INDEPENDENT_AMBULATORY_CARE_PROVIDER_SITE_OTHER): Payer: Medicare Other | Admitting: Surgery

## 2019-11-23 ENCOUNTER — Encounter: Payer: Self-pay | Admitting: Emergency Medicine

## 2019-11-23 ENCOUNTER — Encounter: Payer: Self-pay | Admitting: Surgery

## 2019-11-23 ENCOUNTER — Other Ambulatory Visit: Payer: Self-pay

## 2019-11-23 VITALS — BP 151/93 | HR 70 | Temp 98.3°F | Resp 12 | Ht 68.0 in | Wt 133.8 lb

## 2019-11-23 DIAGNOSIS — Z9889 Other specified postprocedural states: Secondary | ICD-10-CM

## 2019-11-23 DIAGNOSIS — Z8719 Personal history of other diseases of the digestive system: Secondary | ICD-10-CM

## 2019-11-23 NOTE — Progress Notes (Signed)
[facility] POST-OP OFFICE VISIT  11/23/2019  HPI: Hunter Oconnor is a 69 y.o. male 13 days s/p robotic left inguinal hernia repair.  He reports a mild degree of tenderness in the left groin region, denying any numbness or paresthesias extending into his scrotum.  Denies any bulge or swelling in the region.  He reports he was constipated the first few days postop, this is resolved.  Denies any issues with his incisions.  Denies swelling, problems voiding, fever, chills, nausea or vomiting.  He would like to return to work.  Vital signs: BP (!) 151/93   Pulse 70   Temp 98.3 F (36.8 C)   Resp 12   Ht 5\' 8"  (1.727 m)   Wt 133 lb 12.8 oz (60.7 kg)   SpO2 98%   BMI 20.34 kg/m    Physical Exam: Constitutional: He appears well. Abdomen: Soft benign.  There is no evidence of swelling in the left groin.  There is more laxity in the previously repaired right groin. Skin: Incisions are clean, dry and intact.  There is no evidence of ecchymosis in the left groin.  No tenderness whatsoever.  Assessment/Plan: This is a 69 y.o. male 13 days s/p robotic left inguinal hernia repair.  Progressing well  Patient Active Problem List   Diagnosis Date Noted  . Left inguinal hernia 10/12/2019  . Borderline hypertension 07/10/2014  . 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 CAFL (chronic airflow limitation) ([place]) 07/10/2014  . Vitamin D deficiency 07/10/2014  . Hypercholesteremia 07/10/2014  . Acid reflux 07/10/2014  . Abdominal hernia 07/10/2014  . Blood glucose elevated 07/10/2014  . HLD (hyperlipidemia) 07/10/2014  . Acquired hypothyroidism 07/10/2014  . Cannot sleep 07/10/2014  . Psoriasis 07/10/2014  . Compulsive tobacco user syndrome 07/10/2014  . Avitaminosis D 07/10/2014    -He may resume work, with cautions to avoid any excess heavy lifting considering the potential for a surprise episode of pain.  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  Advised he should follow-up with [place] should any unexpected changes occur, or any deterioration he thinks may be related.  Glad to see him again as needed.   Ronny Bacon M.D., FACS 11/23/2019, 10:48 AM

## 2019-11-23 NOTE — Patient Instructions (Signed)
Follow up as needed, call the office if you have any questions or concerns.   GENERAL POST-OPERATIVE PATIENT INSTRUCTIONS   WOUND CARE INSTRUCTIONS:  Keep a dry clean dressing on the wound if there is drainage. The initial bandage may be removed after 24 hours.  Once the wound has quit draining you may leave it open to air.  If clothing rubs against the wound or causes irritation and the wound is not draining you may cover it with a dry dressing during the daytime.  Try to keep the wound dry and avoid ointments on the wound unless directed to do so.  If the wound becomes bright red and painful or starts to drain infected material that is not clear, please contact your physician immediately.  If the wound is mildly pink and has a thick firm ridge underneath it, this is normal, and is referred to as a healing ridge.  This will resolve over the next 4-6 weeks.  BATHING: You may shower if you have been informed of this by your surgeon. However, Please do not submerge in a tub, hot tub, or pool until incisions are completely sealed or have been told by your surgeon that you may do so.  DIET:  You may eat any foods that you can tolerate.  It is a good idea to eat a high fiber diet and take in plenty of fluids to prevent constipation.  If you do become constipated you may want to take a mild laxative or take ducolax tablets on a daily basis until your bowel habits are regular.  Constipation can be very uncomfortable, along with straining, after recent surgery.  ACTIVITY:  You are encouraged to cough and deep breath or use your incentive spirometer if you were given one, every 15-30 minutes when awake.  This will help prevent respiratory complications and low grade fevers post-operatively if you had a general anesthetic.  You may want to hug a pillow when coughing and sneezing to add additional support to the surgical area, if you had abdominal or chest surgery, which will decrease pain during these times.  You  are encouraged to walk and engage in light activity for the next two weeks.  You should not lift more than 10-15 pounds, 4-6 weeks after surgery as it could put you at increased risk for complications.  Twenty pounds is roughly equivalent to a plastic bag of groceries. At that time- Listen to your body when lifting, if you have pain when lifting, stop and then try again in a few days. Soreness after doing exercises or activities of daily living is normal as you get back in to your normal routine.  MEDICATIONS:  Try to take narcotic medications and anti-inflammatory medications, such as tylenol, ibuprofen, naprosyn, etc., with food.  This will minimize stomach upset from the medication.  Should you develop nausea and vomiting from the pain medication, or develop a rash, please discontinue the medication and contact your physician.  You should not drive, make important decisions, or operate machinery when taking narcotic pain medication.  SUNBLOCK Use sun block to incision area over the next year if this area will be exposed to sun. This helps decrease scarring and will allow you avoid a permanent darkened area over your incision.  QUESTIONS:  Please feel free to call our office if you have any questions, and we will be glad to assist you.

## 2019-12-07 ENCOUNTER — Other Ambulatory Visit
Admission: RE | Admit: 2019-12-07 | Discharge: 2019-12-07 | Disposition: A | Discharge status: Home / Self Care | Payer: Medicare Other | Source: Ambulatory Visit | Attending: Gastroenterology | Admitting: Gastroenterology

## 2019-12-07 ENCOUNTER — Other Ambulatory Visit: Payer: Self-pay

## 2019-12-07 DIAGNOSIS — Z20822 Contact with and (suspected) exposure to covid-19: Secondary | ICD-10-CM | POA: Diagnosis not present

## 2019-12-07 DIAGNOSIS — Z01812 Encounter for preprocedural laboratory examination: Secondary | ICD-10-CM | POA: Insufficient documentation

## 2019-12-07 LAB — SARS CORONAVIRUS 2 (TAT 6-24 HRS): SARS Coronavirus 2: NEGATIVE

## 2019-12-11 ENCOUNTER — Encounter: Payer: Self-pay | Admitting: Gastroenterology

## 2019-12-11 ENCOUNTER — Ambulatory Visit: Payer: Medicare Other | Admitting: Certified Registered"

## 2019-12-11 ENCOUNTER — Ambulatory Visit
Admission: RE | Admit: 2019-12-11 | Discharge: 2019-12-11 | Disposition: A | Discharge status: Home / Self Care | Payer: Medicare Other | Attending: Gastroenterology | Admitting: Gastroenterology

## 2019-12-11 ENCOUNTER — Other Ambulatory Visit: Payer: Self-pay

## 2019-12-11 ENCOUNTER — Encounter
Admission: RE | Disposition: A | Discharge status: Home / Self Care | Payer: Self-pay | Source: Home / Self Care | Attending: Gastroenterology

## 2019-12-11 DIAGNOSIS — K640 First degree hemorrhoids: Secondary | ICD-10-CM | POA: Diagnosis not present

## 2019-12-11 DIAGNOSIS — Z79899 Other long term (current) drug therapy: Secondary | ICD-10-CM | POA: Diagnosis not present

## 2019-12-11 DIAGNOSIS — D122 Benign neoplasm of ascending colon: Secondary | ICD-10-CM | POA: Diagnosis not present

## 2019-12-11 DIAGNOSIS — Z86010 Personal history of colonic polyps: Secondary | ICD-10-CM | POA: Diagnosis not present

## 2019-12-11 DIAGNOSIS — Z8249 Family history of ischemic heart disease and other diseases of the circulatory system: Secondary | ICD-10-CM | POA: Diagnosis not present

## 2019-12-11 DIAGNOSIS — J449 Chronic obstructive pulmonary disease, unspecified: Secondary | ICD-10-CM | POA: Insufficient documentation

## 2019-12-11 DIAGNOSIS — D123 Benign neoplasm of transverse colon: Secondary | ICD-10-CM | POA: Diagnosis not present

## 2019-12-11 DIAGNOSIS — Z800 Family history of malignant neoplasm of digestive organs: Secondary | ICD-10-CM | POA: Diagnosis not present

## 2019-12-11 DIAGNOSIS — Z809 Family history of malignant neoplasm, unspecified: Secondary | ICD-10-CM | POA: Insufficient documentation

## 2019-12-11 DIAGNOSIS — K635 Polyp of colon: Secondary | ICD-10-CM | POA: Diagnosis not present

## 2019-12-11 DIAGNOSIS — Z7951 Long term (current) use of inhaled steroids: Secondary | ICD-10-CM | POA: Diagnosis not present

## 2019-12-11 DIAGNOSIS — D120 Benign neoplasm of cecum: Secondary | ICD-10-CM | POA: Diagnosis not present

## 2019-12-11 DIAGNOSIS — Z1211 Encounter for screening for malignant neoplasm of colon: Secondary | ICD-10-CM | POA: Insufficient documentation

## 2019-12-11 DIAGNOSIS — F17210 Nicotine dependence, cigarettes, uncomplicated: Secondary | ICD-10-CM | POA: Diagnosis not present

## 2019-12-11 HISTORY — PX: COLONOSCOPY WITH PROPOFOL: SHX5780

## 2019-12-11 SURGERY — COLONOSCOPY WITH PROPOFOL
Anesthesia: General

## 2019-12-11 MED ORDER — PROPOFOL 500 MG/50ML IV EMUL
INTRAVENOUS | Status: DC | PRN
  Administered 2019-12-11: 150 ug/kg/min via INTRAVENOUS

## 2019-12-11 MED ORDER — PROPOFOL 10 MG/ML IV BOLUS
INTRAVENOUS | Status: AC
  Filled 2019-12-11: qty 20

## 2019-12-11 MED ORDER — GLYCOPYRROLATE 0.2 MG/ML IJ SOLN
INTRAMUSCULAR | Status: DC | PRN
  Administered 2019-12-11: .2 mg via INTRAVENOUS

## 2019-12-11 MED ORDER — PROPOFOL 10 MG/ML IV BOLUS
INTRAVENOUS | Status: DC | PRN
  Administered 2019-12-11: 50 mg via INTRAVENOUS

## 2019-12-11 MED ORDER — SODIUM CHLORIDE 0.9 % IV SOLN
INTRAVENOUS | Status: DC
  Administered 2019-12-11: 20 mL/h via INTRAVENOUS

## 2019-12-11 MED ORDER — PROPOFOL 500 MG/50ML IV EMUL
INTRAVENOUS | Status: AC
  Filled 2019-12-11: qty 50

## 2019-12-11 NOTE — Anesthesia Preprocedure Evaluation (Signed)
Anesthesia Evaluation  Patient identified by MRN, date of birth, ID band Patient awake    Reviewed: Allergy & Precautions, NPO status , Patient's Chart, lab work & pertinent test results  History of Anesthesia Complications Negative for: history of anesthetic complications  Airway Mallampati: II  TM Distance: >3 FB Neck ROM: Full    Dental  (+) Poor Dentition   Pulmonary neg shortness of breath, COPD,  COPD inhaler, neg recent URI, Current Smoker and Patient abstained from smoking.,    breath sounds clear to auscultation- rhonchi (-) wheezing      Cardiovascular (-) hypertension(-) CAD, (-) Past MI, (-) Cardiac Stents and (-) CABG  Rhythm:Regular Rate:Normal - Systolic murmurs and - Diastolic murmurs    Neuro/Psych neg Seizures negative neurological ROS  negative psych ROS   GI/Hepatic Neg liver ROS, GERD  ,  Endo/Other  neg diabetesHypothyroidism   Renal/GU negative Renal ROS     Musculoskeletal negative musculoskeletal ROS (+)   Abdominal (+) - obese,   Peds  Hematology negative hematology ROS (+)   Anesthesia Other Findings Past Medical History: No date: COPD (chronic obstructive pulmonary disease) (HCC) No date: Hernia of abdominal wall No date: Hyperlipidemia No date: Hypothyroid No date: Insomnia   Reproductive/Obstetrics                             Anesthesia Physical  Anesthesia Plan  ASA: II  Anesthesia Plan: General   Post-op Pain Management:    Induction: Intravenous  PONV Risk Score and Plan: 1 and Propofol infusion and TIVA  Airway Management Planned: Natural Airway and Nasal Cannula  Additional Equipment:   Intra-op Plan:   Post-operative Plan:   Informed Consent: I have reviewed the patients History and Physical, chart, labs and discussed the procedure including the risks, benefits and alternatives for the proposed anesthesia with the patient or authorized  representative who has indicated his/her understanding and acceptance.     Dental advisory given  Plan Discussed with: CRNA and Anesthesiologist  Anesthesia Plan Comments:         Anesthesia Quick Evaluation

## 2019-12-11 NOTE — Progress Notes (Signed)
   12/11/19 0742  Clinical Encounter Type  Visited With Family  Visit Type Initial  Referral From Chaplain  Consult/Referral To Chaplain  While rounding SDS waiting area, chaplain briefly spoke with Pt and wife, Hunter Oconnor. Pt said he was sleepy and wanted to eat. He explained he was having a colonoscopy. Chaplain confirmed the worst part about a colonoscopy was the prep part and he agreed. Pt told chaplain that he was going to [employer]. Chaplain wished them well and left.

## 2019-12-11 NOTE — Anesthesia Postprocedure Evaluation (Signed)
Anesthesia Post Note  Patient: Hunter Oconnor  Procedure(s) Performed: COLONOSCOPY WITH PROPOFOL (N/A )  Patient location during evaluation: Endoscopy Anesthesia Type: General Level of consciousness: awake and alert Pain management: pain level controlled Vital Signs Assessment: post-procedure vital signs reviewed and stable Respiratory status: spontaneous breathing, nonlabored ventilation, respiratory function stable and patient connected to nasal cannula oxygen Cardiovascular status: blood pressure returned to baseline and stable Postop Assessment: no apparent nausea or vomiting Anesthetic complications: no   No complications documented.   Last Vitals:  Vitals:   12/11/19 0928 12/11/19 0938  BP: (!) 138/91 (!) 147/85  Pulse: 73 65  Resp: (!) 24 15  Temp:    SpO2: 98% 100%    Last Pain:  Vitals:   12/11/19 0938  TempSrc:   PainSc: 0-No pain                 Martha Clan

## 2019-12-11 NOTE — Transfer of Care (Signed)
Immediate Anesthesia Transfer of Care Note  Patient: Hunter Oconnor  Procedure(s) Performed: COLONOSCOPY WITH PROPOFOL (N/A )  Patient Location: PACU and Endoscopy Unit  Anesthesia Type:General  Level of Consciousness: drowsy  Airway & Oxygen Therapy: Patient Spontanous Breathing  Post-op Assessment: Report given to RN  Post vital signs: stable  Last Vitals:  Vitals Value Taken Time  BP    Temp    Pulse    Resp    SpO2      Last Pain:  Vitals:   12/11/19 0752  TempSrc: Temporal  PainSc: 0-No pain         Complications: No complications documented.

## 2019-12-11 NOTE — H&P (Signed)
Hunter Bellows, MD [address], [place], [place], [place], [address] [address], [place], [place], [place], [address] Phone: [phone]  Fax: [phone]  Primary Care Physician:  Jerrol Banana., MD   Pre-Procedure History & Physical: HPI:  Hunter Oconnor is a 69 y.o. male is here for an colonoscopy.   Past Medical History:  Diagnosis Date  . COPD (chronic obstructive pulmonary disease) ([place])   . Hernia of abdominal wall   . Hyperlipidemia   . Hypothyroid   . Insomnia     Past Surgical History:  Procedure Laterality Date  . APPENDECTOMY    . BACK SURGERY     neck    . CERVICAL DISC SURGERY  2008   Ruptured disc  . HERNIA REPAIR    . INGUINAL HERNIA REPAIR Right   . SHOULDER SURGERY    . XI ROBOTIC ASSISTED INGUINAL HERNIA REPAIR WITH MESH Left 11/10/2019   Procedure: XI ROBOTIC ASSISTED INGUINAL HERNIA REPAIR WITH MESH;  Surgeon: Ronny Bacon, MD;  Location: [facility] ORS;  Service: General;  Laterality: Left;    Prior to Admission medications   Medication Sig Start Date End Date Taking? Authorizing Provider  Ascorbic Acid (VITAMIN C) 500 MG CHEW Chew by mouth.   Yes [provider]  Cholecalciferol (VITAMIN D3) 125 MCG (5000 UT) CHEW Chew by mouth.   Yes [provider]  fluticasone (FLOVENT HFA) 110 MCG/ACT inhaler Inhale 2 puffs into the lungs daily. Patient taking differently: Inhale 2 puffs into the lungs daily as needed (shortness of breath).  09/28/19  Yes Jerrol Banana., MD  levothyroxine (SYNTHROID) 150 MCG tablet Take 1 tablet (150 mcg total) by mouth daily. 09/28/19  Yes Jerrol Banana., MD  pravastatin (PRAVACHOL) 40 MG tablet Take 1 tablet (40 mg total) by mouth daily. 09/28/19  Yes Jerrol Banana., MD  traZODone (DESYREL) 100 MG tablet TAKE 1 TABLET AT BEDTIME 11/01/19  Yes Jerrol Banana., MD  zinc gluconate 50 MG tablet Take 50 mg by mouth daily.   Yes [provider]    Allergies as of  10/19/2019  . (No Known Allergies)    Family History  Problem Relation Age of Onset  . Cancer Mother        colon ca  . Hypertension Father   . Cancer Father     Social History   Socioeconomic History  . Marital status: Married    Spouse name: Not on file  . Number of children: 1  . Years of education: Not on file  . Highest education level: Associate degree: occupational, Hotel manager, or vocational program  Occupational History    Comment: part time  Tobacco Use  . Smoking status: Current Every Day Smoker    Packs/day: 0.50    Types: Cigarettes  . Smokeless tobacco: Never Used  Vaping Use  . Vaping Use: Never used  Substance and Sexual Activity  . Alcohol use: Yes    Comment: 1-2 drinks on the weekends  . Drug use: No  . Sexual activity: Not on file  Other Topics Concern  . Not on file  Social History Narrative  . Not on file   Social Determinants of Health   Financial Resource Strain: Low Risk   . Difficulty of Paying Living Expenses: Not hard at all  Food Insecurity: No Food Insecurity  . Worried About Charity fundraiser in the Last Year: Never true  . Ran Out of Food  in the Last Year: Never true  Transportation Needs: No Transportation Needs  . Lack of Transportation (Medical): No  . Lack of Transportation (Non-Medical): No  Physical Activity: Inactive  . Days of Exercise per Week: 0 days  . Minutes of Exercise per Session: 0 min  Stress: No Stress Concern Present  . Feeling of Stress : Not at all  Social Connections: Moderately Isolated  . Frequency of Communication with Friends and Family: More than three times a week  . Frequency of Social Gatherings with Friends and Family: More than three times a week  . Attends Religious Services: Never  . Active Member of Clubs or Organizations: No  . Attends Archivist Meetings: Never  . Marital Status: Married  Human resources officer Violence: Not At Risk  . Fear of Current or Ex-Partner: No  .  Emotionally Abused: No  . Physically Abused: No  . Sexually Abused: No    Review of Systems: See HPI, otherwise negative ROS  Physical Exam: BP (!) 159/86   Pulse 64   Temp (!) 97.3 F (36.3 C) (Temporal)   Resp 18   Ht 5\' 8"  (1.727 m)   Wt 65.8 kg   SpO2 99%   BMI 22.05 kg/m  General:   Alert,  pleasant and cooperative in NAD Head:  Normocephalic and atraumatic. Neck:  Supple; no masses or thyromegaly. Lungs:  Clear throughout to auscultation, normal respiratory effort.    Heart:  +S1, +S2, Regular rate and rhythm, No edema. Abdomen:  Soft, nontender and nondistended. Normal bowel sounds, without guarding, and without rebound.   Neurologic:  Alert and  oriented x4;  grossly normal neurologically.  Impression/Plan: Hunter Oconnor is here for an colonoscopy to be performed for surveillance due to prior history of colon polyps   Risks, benefits, limitations, and alternatives regarding  colonoscopy have been reviewed with the patient.  Questions have been answered.  All parties agreeable.   Hunter Bellows, MD  12/11/2019, 8:34 AM

## 2019-12-11 NOTE — Op Note (Signed)
[facility] Gastroenterology Patient Name: Hunter Oconnor Procedure Date: 12/11/2019 8:31 AM MRN: [id number] Account #: [id number] Date of Birth: 16-Oct-1950 Admit Type: Outpatient Age: 69 Room: [facility] ENDO ROOM 4 Gender: Male Note Status: Finalized Procedure:             Colonoscopy Indications:           Surveillance: Personal history of adenomatous polyps                         on last colonoscopy > 5 years ago Providers:             Jonathon Bellows MD, MD Referring MD:          Janine Ores. Rosanna Randy, MD (Referring MD) Medicines:             Monitored Anesthesia Care Complications:         No immediate complications. Procedure:             Pre-Anesthesia Assessment:                        - Prior to the procedure, a History and Physical was                         performed, and patient medications, allergies and                         sensitivities were reviewed. The patient's tolerance                         of previous anesthesia was reviewed.                        - The risks and benefits of the procedure and the                         sedation options and risks were discussed with the                         patient. All questions were answered and informed                         consent was obtained.                        - ASA Grade Assessment: II - A patient with mild                         systemic disease.                        After obtaining informed consent, the colonoscope was                         passed under direct vision. Throughout the procedure,                         the patient's blood pressure, pulse, and oxygen                         saturations were monitored  continuously. The                         Colonoscope was introduced through the anus and                         advanced to the the cecum, identified by the                         appendiceal orifice. The colonoscopy was performed                         with ease. The patient  tolerated the procedure well.                         The quality of the bowel preparation was poor. Findings:      The perianal and digital rectal examinations were normal.      A 12 mm polyp was found in the appendiceal orifice. The polyp was       sessile. The polyp was removed with a piecemeal technique using a cold       snare. Resection and retrieval were complete.      Two sessile polyps were found in the ascending colon. The polyps were 5       to 8 mm in size. These polyps were removed with a cold snare. Resection       and retrieval were complete.      Two sessile polyps were found in the transverse colon. The polyps were 8       to 10 mm in size. These polyps were removed with a cold snare. Resection       and retrieval were complete.      Non-bleeding internal hemorrhoids were found during retroflexion. The       hemorrhoids were large and Grade I (internal hemorrhoids that do not       prolapse).      The exam was otherwise without abnormality on direct and retroflexion       views. Impression:            - Preparation of the colon was poor.                        - One 12 mm polyp at the appendiceal orifice, removed                         piecemeal using a cold snare. Resected and retrieved.                        - Two 5 to 8 mm polyps in the ascending colon, removed                         with a cold snare. Resected and retrieved.                        - Two 8 to 10 mm polyps in the transverse colon,                         removed with a cold snare. Resected and retrieved.                        -  Non-bleeding internal hemorrhoids.                        - The examination was otherwise normal on direct and                         retroflexion views. Recommendation:        - Discharge patient to home (with escort).                        - Resume previous diet.                        - Continue present medications.                        - Await pathology results.                         - Repeat colonoscopy in 5 months for surveillance                         after piecemeal polypectomy. Procedure Code(s):     --- Professional ---                        [phone], Colonoscopy, flexible; with removal of                         tumor(s), polyp(s), or other lesion(s) by snare                         technique Diagnosis Code(s):     --- Professional ---                        K63.5, Polyp of colon                        Z86.010, Personal history of colonic polyps                        K64.0, First degree hemorrhoids CPT copyright 2019 American Medical Association. All rights reserved. The codes documented in this report are preliminary and upon coder review may  be revised to meet current compliance requirements. Jonathon Bellows, MD Jonathon Bellows MD, MD 12/11/2019 9:07:30 AM This report has been signed electronically. Number of Addenda: 0 Note Initiated On: 12/11/2019 8:31 AM Scope Withdrawal Time: 0 hours 13 minutes 54 seconds  Total Procedure Duration: 0 hours 20 minutes 24 seconds  Estimated Blood Loss:  Estimated blood loss: none.      [facility]

## 2019-12-12 ENCOUNTER — Encounter: Payer: Self-pay | Admitting: Family Medicine

## 2019-12-12 DIAGNOSIS — E039 Hypothyroidism, unspecified: Secondary | ICD-10-CM

## 2019-12-12 LAB — SURGICAL PATHOLOGY

## 2019-12-13 ENCOUNTER — Encounter: Payer: Self-pay | Admitting: Gastroenterology

## 2019-12-13 MED ORDER — LEVOTHYROXINE SODIUM 150 MCG PO TABS: 150.0000 ug | ORAL_TABLET | Freq: Every day | ORAL | 1 refills | Status: DC

## 2020-02-06 ENCOUNTER — Ambulatory Visit (INDEPENDENT_AMBULATORY_CARE_PROVIDER_SITE_OTHER): Payer: Medicare Other | Admitting: Family Medicine

## 2020-02-06 ENCOUNTER — Encounter: Payer: Self-pay | Admitting: Family Medicine

## 2020-02-06 ENCOUNTER — Other Ambulatory Visit: Payer: Self-pay

## 2020-02-06 VITALS — BP 132/76 | HR 64 | Temp 98.3°F | Resp 16 | Ht 68.0 in | Wt 143.0 lb

## 2020-02-06 DIAGNOSIS — E039 Hypothyroidism, unspecified: Secondary | ICD-10-CM | POA: Diagnosis not present

## 2020-02-06 DIAGNOSIS — E7800 Pure hypercholesterolemia, unspecified: Secondary | ICD-10-CM | POA: Diagnosis not present

## 2020-02-06 DIAGNOSIS — J449 Chronic obstructive pulmonary disease, unspecified: Secondary | ICD-10-CM | POA: Insufficient documentation

## 2020-02-06 DIAGNOSIS — M25559 Pain in unspecified hip: Secondary | ICD-10-CM | POA: Diagnosis not present

## 2020-02-06 DIAGNOSIS — E559 Vitamin D deficiency, unspecified: Secondary | ICD-10-CM | POA: Diagnosis not present

## 2020-02-06 DIAGNOSIS — J441 Chronic obstructive pulmonary disease with (acute) exacerbation: Secondary | ICD-10-CM

## 2020-02-06 DIAGNOSIS — F17210 Nicotine dependence, cigarettes, uncomplicated: Secondary | ICD-10-CM

## 2020-02-06 DIAGNOSIS — Z720 Tobacco use: Secondary | ICD-10-CM | POA: Diagnosis not present

## 2020-02-06 MED ORDER — CHANTIX STARTING MONTH PAK 0.5 MG X 11 & 1 MG X 42 PO TABS: ORAL_TABLET | ORAL | 0 refills | Status: DC

## 2020-02-06 NOTE — Patient Instructions (Signed)

## 2020-02-06 NOTE — Progress Notes (Signed)
Established patient visit   Patient: Hunter Oconnor   DOB: 01/24/1951   70 y.o. Male  MRN: [id number] Visit Date: 02/06/2020  Today's healthcare provider: Wilhemena Durie, MD   Chief Complaint  Patient presents with  . Hypothyroidism  . Hyperlipidemia  . COPD   Subjective    HPI  Patient is married father of 1 with 2 grandchildren and 2 step grandchildren.  Continues to smoke and wants to try Chantix to quit. His 1 complaint today is 1 of "hip spasms with sitting.  Does not bother him at night and walking is not a problem.  Step and stands the spasms in his hips and buttocks will improve and go away.  He has had COVID shots and booster. 10/17/2019 AWV with [facility] 04/09/2015 PSA-1.1 12/11/2019 Colonoscopy-poor prep, polyps, internal hemorrhoids, repeat in 5 months  Hypothyroid, follow-up  Lab Results  Component Value Date   TSH 2.490 10/05/2019   TSH 1.500 10/22/2017   TSH 4.810 (H) 04/24/2016   Wt Readings from Last 3 Encounters:  02/06/20 143 lb (64.9 kg)  12/11/19 145 lb (65.8 kg)  11/23/19 133 lb 12.8 oz (60.7 kg)    He was last seen for hypothyroid 4 months ago.  Management since that visit includes no changes. He reports excellent compliance with treatment. He is not having side effects.   Symptoms: No change in energy level No constipation  No diarrhea No heat / cold intolerance  No nervousness No palpitations  No weight changes    ----------------------------------------------------------------------------------------- Lipid/Cholesterol, Follow-up  Last lipid panel Other pertinent labs  Lab Results  Component Value Date   CHOL 144 10/05/2019   HDL 48 10/05/2019   LDLCALC 81 10/05/2019   TRIG 74 10/05/2019   CHOLHDL 3.0 10/05/2019   Lab Results  Component Value Date   ALT 23 11/08/2019   AST 19 11/08/2019   PLT 111 (L) 11/08/2019   TSH 2.490 10/05/2019     He was last seen for this 4 months ago.  Management since that visit includes no  changes.  He reports excellent compliance with treatment. He is not having side effects.  Symptoms: No chest pain No chest pressure/discomfort  No dyspnea No lower extremity edema  No numbness or tingling of extremity No orthopnea  No palpitations No paroxysmal nocturnal dyspnea  No speech difficulty No syncope   Current diet: in general, a "healthy" diet   Current exercise: none  The 10-year ASCVD risk score Mikey Bussing DC Jr., et al., 2013) is: 21%  --------------------------------------------------------------------------------------------------- COPD, Follow up  He was last seen for this 4 months ago. Changes made include no changes.   He reports excellent compliance with treatment. He is not having side effects.  he uses rescue inhaler 1 per days. He IS experiencing cough. He is NOT experiencing dyspnea or wheezing. he reports breathing is Improved.  Pulmonary Functions Testing Results:  No results found for: FEV1, FVC, FEV1FVC, TLC  -----------------------------------------------------------------------------------------  Patient Active Problem List   Diagnosis Date Noted  . Status post laparoscopic hernia repair 11/23/2019  . Borderline hypertension 07/10/2014  . 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 CAFL (chronic airflow limitation) ([place]) 07/10/2014  . Vitamin D deficiency 07/10/2014  . Hypercholesteremia 07/10/2014  . Acid reflux 07/10/2014  . Abdominal hernia 07/10/2014  . Blood glucose elevated 07/10/2014  . HLD (hyperlipidemia) 07/10/2014  . Acquired hypothyroidism 07/10/2014  . Cannot sleep 07/10/2014  . Psoriasis 07/10/2014  . Compulsive tobacco user syndrome 07/10/2014  .  Avitaminosis D 07/10/2014   Past Surgical History:  Procedure Laterality Date  . APPENDECTOMY    . BACK SURGERY     neck    . CERVICAL DISC SURGERY  2008   Ruptured disc  . COLONOSCOPY WITH PROPOFOL N/A 12/11/2019   Procedure: COLONOSCOPY WITH PROPOFOL;  Surgeon: Wyline Mood, MD;  Location[facility] ENDOSCOPY;  Service: Gastroenterology;  Laterality: N/A;  . HERNIA REPAIR    . INGUINAL HERNIA REPAIR Right   . SHOULDER SURGERY    . XI ROBOTIC ASSISTED INGUINAL HERNIA REPAIR WITH MESH Left 11/10/2019   Procedure: XI ROBOTIC ASSISTED INGUINAL HERNIA REPAIR WITH MESH;  Surgeon: Campbell Lerner, MD;  Location: [facility] ORS;  Service: General;  Laterality: Left;   Social History   Socioeconomic History  . Marital status: Married    Spouse name: Not on file  . Number of children: 1  . Years of education: Not on file  . Highest education level: Associate degree: occupational, Scientist, product/process development, or vocational program  Occupational History    Comment: part time  Tobacco Use  . Smoking status: Current Every Day Smoker    Packs/day: 0.50    Types: Cigarettes  . Smokeless tobacco: Never Used  Vaping Use  . Vaping Use: Never used  Substance and Sexual Activity  . Alcohol use: Yes    Comment: 1-2 drinks on the weekends  . Drug use: No  . Sexual activity: Not on file  Other Topics Concern  . Not on file  Social History Narrative  . Not on file   Social Determinants of Health   Financial Resource Strain: Low Risk   . Difficulty of Paying Living Expenses: Not hard at all  Food Insecurity: No Food Insecurity  . Worried About Programme researcher, broadcasting/film/video in the Last Year: Never true  . Ran Out of Food in the Last Year: Never true  Transportation Needs: No Transportation Needs  . Lack of Transportation (Medical): No  . Lack of Transportation (Non-Medical): No  Physical Activity: Inactive  . Days of Exercise per Week: 0 days  . Minutes of Exercise per Session: 0 min  Stress: No Stress Concern Present  . Feeling of Stress : Not at all  Social Connections: Moderately Isolated  . Frequency of Communication with Friends and Family: More than three times a week  . Frequency of Social Gatherings with Friends and Family: More than three times a week  . Attends Religious Services:  Never  . Active Member of Clubs or Organizations: No  . Attends Banker Meetings: Never  . Marital Status: Married  Catering manager Violence: Not At Risk  . Fear of Current or Ex-Partner: No  . Emotionally Abused: No  . Physically Abused: No  . Sexually Abused: No   Family History  Problem Relation Age of Onset  . Cancer Mother        colon ca  . Hypertension Father   . Cancer Father    No Known Allergies     Medications: Outpatient Medications Prior to Visit  Medication Sig  . fluticasone (FLOVENT HFA) 110 MCG/ACT inhaler Inhale 2 puffs into the lungs daily. (Patient taking differently: Inhale 2 puffs into the lungs daily as needed (shortness of breath).)  . levothyroxine (SYNTHROID) 150 MCG tablet Take 1 tablet (150 mcg total) by mouth daily.  . pravastatin (PRAVACHOL) 40 MG tablet Take 1 tablet (40 mg total) by mouth daily.  . traZODone (DESYREL) 100 MG tablet TAKE 1 TABLET AT BEDTIME  .  zinc gluconate 50 MG tablet Take 50 mg by mouth daily.  . [DISCONTINUED] Ascorbic Acid (VITAMIN C) 500 MG CHEW Chew by mouth. (Patient not taking: Reported on 02/06/2020)  . [DISCONTINUED] Cholecalciferol (VITAMIN D3) 125 MCG (5000 UT) CHEW Chew by mouth. (Patient not taking: Reported on 02/06/2020)   No facility-administered medications prior to visit.    Review of Systems  Constitutional: Negative.   HENT: Positive for dental problem.   Eyes: Positive for visual disturbance.  Respiratory: Negative.   Cardiovascular: Negative.   Gastrointestinal: Negative.   Endocrine: Negative.   Genitourinary: Negative.   Musculoskeletal: Positive for arthralgias and neck pain.  Skin: Negative.   Allergic/Immunologic: Negative.   Neurological: Negative.   Hematological: Negative.   Psychiatric/Behavioral: Negative.     Last CBC Lab Results  Component Value Date   WBC 6.7 11/08/2019   HGB 15.3 11/08/2019   HCT 44.9 11/08/2019   MCV 94.1 11/08/2019   MCH 32.1 11/08/2019   RDW  14.3 11/08/2019   PLT 111 (L) [id number]   Last metabolic panel Lab Results  Component Value Date   GLUCOSE 94 11/08/2019   NA 138 11/08/2019   K 4.6 11/08/2019   CL 102 11/08/2019   CO2 28 11/08/2019   BUN 20 11/08/2019   CREATININE 1.17 11/08/2019   GFRNONAA >60 11/08/2019   GFRAA 80 10/05/2019   CALCIUM 9.3 11/08/2019   PROT 7.0 11/08/2019   ALBUMIN 4.1 11/08/2019   LABGLOB 2.2 10/05/2019   AGRATIO 2.0 10/05/2019   BILITOT 0.7 11/08/2019   ALKPHOS 85 11/08/2019   AST 19 11/08/2019   ALT 23 11/08/2019   ANIONGAP 8 11/08/2019   Last lipids Lab Results  Component Value Date   CHOL 144 10/05/2019   HDL 48 10/05/2019   LDLCALC 81 10/05/2019   TRIG 74 10/05/2019   CHOLHDL 3.0 10/05/2019   Last hemoglobin A1c Lab Results  Component Value Date   HGBA1C 6.0 (H) 04/24/2016   Last thyroid functions Lab Results  Component Value Date   TSH 2.490 10/05/2019      Objective    BP 132/76 (BP Location: Left Arm, Patient Position: Sitting, Cuff Size: Normal)   Pulse 64   Temp 98.3 F (36.8 C) (Oral)   Resp 16   Ht 5\' 8"  (1.727 m)   Wt 143 lb (64.9 kg)   SpO2 97%   BMI 21.74 kg/m  BP Readings from Last 3 Encounters:  02/06/20 132/76  12/11/19 (!) 147/85  11/23/19 (!) 151/93   Wt Readings from Last 3 Encounters:  02/06/20 143 lb (64.9 kg)  12/11/19 145 lb (65.8 kg)  11/23/19 133 lb 12.8 oz (60.7 kg)      Physical Exam Vitals reviewed.  Constitutional:      Appearance: He is well-developed.  HENT:     Head: Normocephalic and atraumatic.     Right Ear: External ear normal.     Left Ear: External ear normal.     Nose: Nose normal.  Eyes:     Conjunctiva/sclera: Conjunctivae normal.     Pupils: Pupils are equal, round, and reactive to light.  Cardiovascular:     Rate and Rhythm: Normal rate and regular rhythm.     Heart sounds: Normal heart sounds.  Pulmonary:     Effort: Pulmonary effort is normal.     Breath sounds: Normal breath sounds.  Abdominal:      General: Bowel sounds are normal.     Palpations: Abdomen is soft.  Genitourinary:  Penis: Normal.      Prostate: Normal.     Rectum: Normal.  Musculoskeletal:        General: Normal range of motion.     Cervical back: Normal range of motion and neck supple.  Skin:    General: Skin is warm and dry.  Neurological:     General: No focal deficit present.     Mental Status: He is alert and oriented to person, place, and time.  Psychiatric:        Mood and Affect: Mood normal.        Behavior: Behavior normal.        Thought Content: Thought content normal.        Judgment: Judgment normal.       No results found for any visits on 02/06/20.  Assessment & Plan     1. Acquired hypothyroidism  - CBC with Differential/Platelet - TSH  2. Hypercholesteremia On pravastatin - Comprehensive metabolic panel - Lipid Panel With LDL/HDL Ratio  3. COPD exacerbation (HCC) Clinically stable - CBC with Differential/Platelet - CT CHEST LUNG CANCER SCREENING LOW DOSE WO CONTRAST  4. Tobacco abuse disorder Chantix.  Obta screening CT of chest. - CT CHEST LUNG CANCER SCREENING LOW DOSE WO CONTRAST  5. Cigarette smoker  - CT CHEST LUNG CANCER SCREENING LOW DOSE WO CONTRAST  6. Vitamin D deficiency   7. Hip pain Appears to be all muscular.  No other symptoms.  May need work-up for PMR in the future.  Appear to be his back either.  No shoulder issues at all.  No other areas affected.   No follow-ups on file.      I, Wilhemena Durie, MD, have reviewed all documentation for this visit. The documentation on 02/09/20 for the exam, diagnosis, procedures, and orders are all accurate and complete.    Koray Soter Cranford Mon, MD  [facility] [phone] (phone) [phone] (fax)  Will

## 2020-02-09 ENCOUNTER — Telehealth: Payer: Self-pay | Admitting: *Deleted

## 2020-02-09 DIAGNOSIS — E039 Hypothyroidism, unspecified: Secondary | ICD-10-CM | POA: Diagnosis not present

## 2020-02-09 DIAGNOSIS — Z87891 Personal history of nicotine dependence: Secondary | ICD-10-CM

## 2020-02-09 DIAGNOSIS — Z122 Encounter for screening for malignant neoplasm of respiratory organs: Secondary | ICD-10-CM

## 2020-02-09 DIAGNOSIS — J441 Chronic obstructive pulmonary disease with (acute) exacerbation: Secondary | ICD-10-CM | POA: Diagnosis not present

## 2020-02-09 DIAGNOSIS — E7800 Pure hypercholesterolemia, unspecified: Secondary | ICD-10-CM | POA: Diagnosis not present

## 2020-02-09 NOTE — Telephone Encounter (Signed)
Received referral for initial lung cancer screening scan. Contacted patient and obtained smoking history,(current, 53 pack year) as well as answering questions related to screening process. Patient denies signs of lung cancer such as weight loss or hemoptysis. Patient denies comorbidity that would prevent curative treatment if lung cancer were found. Patient is scheduled for shared decision making visit and CT scan on 03/06/20 at 10am.

## 2020-02-10 LAB — COMPREHENSIVE METABOLIC PANEL
ALT: 13 IU/L (ref 0–44)
AST: 14 IU/L (ref 0–40)
Albumin/Globulin Ratio: 1.7 (ref 1.2–2.2)
Albumin: 4.3 g/dL (ref 3.7–4.7)
Alkaline Phosphatase: 102 IU/L (ref 44–121)
BUN/Creatinine Ratio: 16 (ref 10–24)
BUN: 20 mg/dL (ref 8–27)
Bilirubin Total: 0.4 mg/dL (ref 0.0–1.2)
CO2: 24 mmol/L (ref 20–29)
Calcium: 9.4 mg/dL (ref 8.6–10.2)
Chloride: 102 mmol/L (ref 96–106)
Creatinine, Ser: 1.28 mg/dL — ABNORMAL HIGH (ref 0.76–1.27)
GFR calc Af Amer: 65 mL/min/{1.73_m2} (ref >59)
GFR calc non Af Amer: 56 mL/min/{1.73_m2} — ABNORMAL LOW (ref >59)
Globulin, Total: 2.5 g/dL (ref 1.5–4.5)
Glucose: 97 mg/dL (ref 65–99)
Potassium: 4.6 mmol/L (ref 3.5–5.2)
Sodium: 137 mmol/L (ref 134–144)
Total Protein: 6.8 g/dL (ref 6.0–8.5)

## 2020-02-10 LAB — CBC WITH DIFFERENTIAL/PLATELET
Basophils Absolute: 0.1 10*3/uL (ref 0.0–0.2)
Basos: 1 %
EOS (ABSOLUTE): 0.6 10*3/uL — ABNORMAL HIGH (ref 0.0–0.4)
Eos: 8 %
Hematocrit: 46.5 % (ref 37.5–51.0)
Hemoglobin: 15.9 g/dL (ref 13.0–17.7)
Immature Grans (Abs): 0.1 10*3/uL (ref 0.0–0.1)
Immature Granulocytes: 1 %
Lymphocytes Absolute: 1.7 10*3/uL (ref 0.7–3.1)
Lymphs: 22 %
MCH: 32.1 pg (ref 26.6–33.0)
MCHC: 34.2 g/dL (ref 31.5–35.7)
MCV: 94 fL (ref 79–97)
Monocytes Absolute: 0.5 10*3/uL (ref 0.1–0.9)
Monocytes: 6 %
Neutrophils Absolute: 5 10*3/uL (ref 1.4–7.0)
Neutrophils: 62 %
Platelets: 114 10*3/uL — ABNORMAL LOW (ref 150–450)
RBC: 4.96 x10E6/uL (ref 4.14–5.80)
RDW: 13.6 % (ref 11.6–15.4)
WBC: 8 10*3/uL (ref 3.4–10.8)

## 2020-02-10 LAB — LIPID PANEL WITH LDL/HDL RATIO
Cholesterol, Total: 160 mg/dL (ref 100–199)
HDL: 47 mg/dL (ref >39)
LDL Chol Calc (NIH): 94 mg/dL (ref 0–99)
LDL/HDL Ratio: 2 ratio (ref 0.0–3.6)
Triglycerides: 103 mg/dL (ref 0–149)
VLDL Cholesterol Cal: 19 mg/dL (ref 5–40)

## 2020-02-10 LAB — TSH: TSH: 14.7 u[IU]/mL — ABNORMAL HIGH (ref 0.450–4.500)

## 2020-02-20 ENCOUNTER — Other Ambulatory Visit: Payer: Self-pay | Admitting: Family Medicine

## 2020-02-21 MED ORDER — CHANTIX STARTING MONTH PAK 0.5 MG X 11 & 1 MG X 42 PO TABS
ORAL_TABLET | ORAL | 0 refills | Status: DC
Start: 2020-02-21 — End: 2020-02-27

## 2020-02-22 ENCOUNTER — Other Ambulatory Visit: Payer: Self-pay | Admitting: Family Medicine

## 2020-02-22 NOTE — Telephone Encounter (Signed)
   Notes to clinic Chantix has been discontinued according to pharm, see their suggestions for alternatives.

## 2020-02-23 NOTE — Telephone Encounter (Signed)
Please let patient know Chantix is not available.  [place] is still is the best way to quit.  Nicotine gum or patches also work

## 2020-02-23 NOTE — Telephone Encounter (Signed)
Please advise 

## 2020-02-27 ENCOUNTER — Telehealth: Payer: Self-pay | Admitting: Family Medicine

## 2020-02-27 DIAGNOSIS — Z720 Tobacco use: Secondary | ICD-10-CM

## 2020-02-27 MED ORDER — VARENICLINE TARTRATE 1 MG PO TABS: ORAL_TABLET | ORAL | 0 refills | Status: DC

## 2020-02-27 NOTE — Telephone Encounter (Signed)
[facility] was advised.

## 2020-02-27 NOTE — Telephone Encounter (Signed)
Called to inform the doctor that the script sent for varenicline (CHANTIX STARTING MONTH PAK) 0.5 MG X 11 & 1 MG X 42 tablet is discontinued.  They do suggest an alternative for Varenicline, 5MG  or 1MG  loose tablets if PCP approves.  Please call to discuss at [phone]

## 2020-02-27 NOTE — Telephone Encounter (Signed)
Loose tablets are fine with me.  Instructions are the same as a starter pack.  Thank you.

## 2020-02-28 ENCOUNTER — Telehealth: Payer: Self-pay | Admitting: Family Medicine

## 2020-02-28 NOTE — Telephone Encounter (Signed)
Returned call to [employer]. Need clarification on message below.

## 2020-02-28 NOTE — Telephone Encounter (Signed)
Placed in thread in error.

## 2020-02-28 NOTE — Telephone Encounter (Signed)
Patient's wife is concerned with patient's TSH values increasing from 2.49 when tested on 10/05/19 to 14.7 on 02/09/20 Patient's wife would like to be contacted to discuss this matter further when convenient

## 2020-02-28 NOTE — Telephone Encounter (Signed)
That is addressed just with a dose adjustment of his Synthroid

## 2020-02-28 NOTE — Telephone Encounter (Signed)
Patient's wife has made contact concerns with the price of Chantix loose tablets (costing [phone] with insurance) and would like to inquire about an alternative.

## 2020-03-06 ENCOUNTER — Ambulatory Visit
Admission: RE | Admit: 2020-03-06 | Discharge: 2020-03-06 | Disposition: A | Discharge status: Home / Self Care | Payer: Medicare Other | Source: Ambulatory Visit | Attending: Oncology | Admitting: Oncology

## 2020-03-06 ENCOUNTER — Other Ambulatory Visit: Payer: Self-pay

## 2020-03-06 ENCOUNTER — Encounter: Payer: Self-pay | Admitting: Oncology

## 2020-03-06 ENCOUNTER — Inpatient Hospital Stay (HOSPITAL_BASED_OUTPATIENT_CLINIC_OR_DEPARTMENT_OTHER): Payer: Medicare Other | Admitting: Oncology

## 2020-03-06 DIAGNOSIS — Z87891 Personal history of nicotine dependence: Secondary | ICD-10-CM | POA: Insufficient documentation

## 2020-03-06 DIAGNOSIS — F17210 Nicotine dependence, cigarettes, uncomplicated: Secondary | ICD-10-CM | POA: Diagnosis not present

## 2020-03-06 DIAGNOSIS — Z122 Encounter for screening for malignant neoplasm of respiratory organs: Secondary | ICD-10-CM | POA: Diagnosis not present

## 2020-03-06 NOTE — Progress Notes (Signed)
Virtual Visit via Video Note  I connected with [name] on 03/06/20 at 10:00 AM EST by a video enabled telemedicine application and verified that I am speaking with the correct person using two identifiers.  Location: Patient: [facility] Provider: Clinic    I discussed the limitations of evaluation and management by telemedicine and the availability of in person appointments. The patient expressed understanding and agreed to proceed.  I discussed the assessment and treatment plan with the patient. The patient was provided an opportunity to ask questions and all were answered. The patient agreed with the plan and demonstrated an understanding of the instructions.   The patient was advised to call back or seek an in-person evaluation if the symptoms worsen or if the condition fails to improve as anticipated.   In accordance with CMS guidelines, patient has met eligibility criteria including age, absence of signs or symptoms of lung cancer.  Social History   Tobacco Use  . Smoking status: Current Every Day Smoker    Packs/day: 1.00    Years: 53.00    Pack years: 53.00    Types: Cigarettes  . Smokeless tobacco: Never Used  Vaping Use  . Vaping Use: Never used  Substance Use Topics  . Alcohol use: Yes    Comment: 1-2 drinks on the weekends  . Drug use: No      A shared decision-making session was conducted prior to the performance of CT scan. This includes one or more decision aids, includes benefits and harms of screening, follow-up diagnostic testing, over-diagnosis, false positive rate, and total radiation exposure.   Counseling on the importance of adherence to annual lung cancer LDCT screening, impact of co-morbidities, and ability or willingness to undergo diagnosis and treatment is imperative for compliance of the program.   Counseling on the importance of continued smoking cessation for former smokers; the importance of smoking cessation for current smokers, and information about  tobacco cessation interventions have been given to patient including [place] and [place] quit [place] programs.   Written order for lung cancer screening with LDCT has been given to the patient and any and all questions have been answered to the best of my abilities.    Yearly follow up will be coordinated by Burgess Estelle, Thoracic Navigator.  I provided 15 minutes of face-to-face video visit time during this encounter, and > 50% was spent counseling as documented under my assessment & plan.   Jacquelin Hawking, NP

## 2020-03-07 ENCOUNTER — Encounter: Payer: Self-pay | Admitting: *Deleted

## 2020-03-07 NOTE — Progress Notes (Signed)
Requesting pulmonary review of lung screening scan.

## 2020-03-08 NOTE — Progress Notes (Signed)
After review with Dr. Vernard Gambles, Notified patient of LDCT lung cancer screening program results with recommendation for 3 month follow up imaging. Also notified of incidental findings noted below and is encouraged to discuss further with PCP who will receive a copy of this note and/or the CT report. Patient verbalizes understanding.   IMPRESSION: 1. Lung-RADS 3, probably benign findings. Short-term follow-up in 6 months is recommended with repeat low-dose chest CT without contrast (Pulmonary recommends 3 month follow up scan) (please use the following order, "CT CHEST LCS NODULE FOLLOW-UP W/O CM"). Pulmonary nodules, including irregular left lower lobe nodule of volume derived equivalent diameter 7.1 mm. 2. Aortic Atherosclerosis (ICD10-I70.0) and Emphysema (ICD10-J43.9). Coronary artery atherosclerosis.

## 2020-03-22 NOTE — Progress Notes (Signed)
Agree with the documentation as noted below by Burgess Estelle, RN.

## 2020-04-23 ENCOUNTER — Other Ambulatory Visit: Payer: Self-pay | Admitting: Family Medicine

## 2020-04-23 DIAGNOSIS — J441 Chronic obstructive pulmonary disease with (acute) exacerbation: Secondary | ICD-10-CM

## 2020-04-23 MED ORDER — FLOVENT HFA 110 MCG/ACT IN AERO: 2.0000 | INHALATION_SPRAY | Freq: Every day | RESPIRATORY_TRACT | 6 refills | Status: DC

## 2020-04-23 NOTE — Telephone Encounter (Signed)
Requested Prescriptions  Pending Prescriptions Disp Refills  . fluticasone (FLOVENT HFA) 110 MCG/ACT inhaler 12 g 6    Sig: Inhale 2 puffs into the lungs daily.     Pulmonology:  Corticosteroids Passed - 04/23/2020 10:08 AM      Passed - Valid encounter within last 12 months    Recent Outpatient Visits          2 months ago Acquired hypothyroidism   [facility] Jerrol Banana., MD   6 months ago COPD exacerbation [facility])   [facility] Jerrol Banana., MD   1 year ago COPD exacerbation [facility])   [facility] Jerrol Banana., MD   2 years ago COPD exacerbation [facility])   [facility] Jerrol Banana., MD   2 years ago COPD exacerbation [facility])   [facility] Jerrol Banana., MD      Future Appointments            In 3 months Jerrol Banana., MD [facility], [place]

## 2020-04-23 NOTE — Telephone Encounter (Signed)
Copied from [place] [id number]. Topic: Quick Communication - Rx Refill/Question >> Apr 23, 2020 10:05 AM Yvette Rack wrote: Medication: fluticasone (FLOVENT HFA) 110 MCG/ACT inhaler  Has the patient contacted their pharmacy? yes Pt told to contact pcp due to Rx expired  Preferred Pharmacy (with phone number or street name): [id number] [address], [place] Phone: [phone]   Fax: [phone]  Agent: Please be advised that RX refills may take up to 3 business days. We ask that you follow-up with your pharmacy.

## 2020-05-07 ENCOUNTER — Other Ambulatory Visit: Payer: Self-pay | Admitting: Family Medicine

## 2020-05-07 DIAGNOSIS — E039 Hypothyroidism, unspecified: Secondary | ICD-10-CM

## 2020-05-22 ENCOUNTER — Telehealth: Payer: Self-pay | Admitting: *Deleted

## 2020-05-22 ENCOUNTER — Encounter: Payer: Self-pay | Admitting: *Deleted

## 2020-05-22 DIAGNOSIS — Z87891 Personal history of nicotine dependence: Secondary | ICD-10-CM

## 2020-05-22 DIAGNOSIS — R918 Other nonspecific abnormal finding of lung field: Secondary | ICD-10-CM

## 2020-05-22 NOTE — Telephone Encounter (Signed)
Spoke to patient via telephone re: scheduling his 3 month follow up lung screening scan. Current smoker, 1 ppd x 53 yrs. Scan scheduled for 06/03/20 @ 8:30 am.

## 2020-06-03 ENCOUNTER — Other Ambulatory Visit: Payer: Self-pay

## 2020-06-03 ENCOUNTER — Ambulatory Visit
Admission: RE | Admit: 2020-06-03 | Discharge: 2020-06-03 | Disposition: A | Discharge status: Home / Self Care | Payer: Medicare Other | Source: Ambulatory Visit | Attending: Oncology | Admitting: Oncology

## 2020-06-03 DIAGNOSIS — I77810 Thoracic aortic ectasia: Secondary | ICD-10-CM | POA: Diagnosis not present

## 2020-06-03 DIAGNOSIS — R918 Other nonspecific abnormal finding of lung field: Secondary | ICD-10-CM | POA: Diagnosis not present

## 2020-06-03 DIAGNOSIS — M47814 Spondylosis without myelopathy or radiculopathy, thoracic region: Secondary | ICD-10-CM | POA: Diagnosis not present

## 2020-06-03 DIAGNOSIS — Z87891 Personal history of nicotine dependence: Secondary | ICD-10-CM | POA: Diagnosis not present

## 2020-06-03 DIAGNOSIS — I2510 Atherosclerotic heart disease of native coronary artery without angina pectoris: Secondary | ICD-10-CM | POA: Diagnosis not present

## 2020-06-03 DIAGNOSIS — J432 Centrilobular emphysema: Secondary | ICD-10-CM | POA: Diagnosis not present

## 2020-06-04 ENCOUNTER — Telehealth: Payer: Self-pay | Admitting: *Deleted

## 2020-06-04 NOTE — Telephone Encounter (Signed)
Yes lets do PET/CT.

## 2020-06-04 NOTE — Telephone Encounter (Signed)
Notified patient of LDCT lung cancer screening program results with recommendation for PET scan evaluation of enlarging nodule. Also notified of incidental findings noted below and is encouraged to discuss further with PCP who will receive a copy of this note and/or the CT report. Patient verbalizes understanding and is in agreement with this plan. Will forward to pulmonology as well for their opinion.   IMPRESSION: 1. Lung-RADS 4A, suspicious. Mild interval growth of spiculated peripheral left lower lobe pulmonary nodule measuring 8.3 mm in volume derived mean diameter. PET-CT suggested for further characterization at this time. 2. Two vessel coronary atherosclerosis. 3. Aortic Atherosclerosis (ICD10-I70.0) and Emphysema (ICD10-J43.9).

## 2020-06-05 ENCOUNTER — Other Ambulatory Visit: Payer: Self-pay | Admitting: *Deleted

## 2020-06-05 DIAGNOSIS — R911 Solitary pulmonary nodule: Secondary | ICD-10-CM

## 2020-06-11 ENCOUNTER — Ambulatory Visit: Admission: RE | Admit: 2020-06-11 | Payer: Medicare Other | Source: Ambulatory Visit

## 2020-06-13 ENCOUNTER — Other Ambulatory Visit: Payer: Self-pay

## 2020-06-13 ENCOUNTER — Encounter
Admission: RE | Admit: 2020-06-13 | Discharge: 2020-06-13 | Disposition: A | Discharge status: Home / Self Care | Payer: Medicare Other | Source: Ambulatory Visit | Attending: Oncology | Admitting: Oncology

## 2020-06-13 DIAGNOSIS — R911 Solitary pulmonary nodule: Secondary | ICD-10-CM | POA: Insufficient documentation

## 2020-06-13 LAB — GLUCOSE, CAPILLARY: Glucose-Capillary: 92 mg/dL (ref 70–99)

## 2020-06-13 MED ORDER — FLUDEOXYGLUCOSE F - 18 (FDG) INJECTION
7.5000 | Freq: Once | INTRAVENOUS | Status: AC | PRN
  Administered 2020-06-13: 7.94 via INTRAVENOUS

## 2020-06-17 NOTE — Progress Notes (Addendum)
Am I seeing him or he is following up with oncology?  Faythe Casa, NP 06/17/2020 9:42 AM  Obtaining opinion from pulmonary. I will manage follow up.   IMPRESSION: 1. 11 mm left lower lobe nodule does not demonstrate any hypermetabolism. Recommend continued CT surveillance. A follow-up noncontrast chest CT in 6 months is reasonable. 2. No enlarged or hypermetabolic mediastinal or hilar lymph nodes. 3. Stable advanced atherosclerotic disease involving the chest and abdomen. 4. Stable emphysematous changes and pulmonary scarring. No new pulmonary findings.

## 2020-07-22 ENCOUNTER — Ambulatory Visit: Payer: Medicare Other | Admitting: Family Medicine

## 2020-07-23 DIAGNOSIS — H6121 Impacted cerumen, right ear: Secondary | ICD-10-CM | POA: Diagnosis not present

## 2020-07-23 DIAGNOSIS — H6501 Acute serous otitis media, right ear: Secondary | ICD-10-CM | POA: Diagnosis not present

## 2020-07-23 DIAGNOSIS — H90A31 Mixed conductive and sensorineural hearing loss, unilateral, right ear with restricted hearing on the contralateral side: Secondary | ICD-10-CM | POA: Diagnosis not present

## 2020-08-08 ENCOUNTER — Ambulatory Visit (INDEPENDENT_AMBULATORY_CARE_PROVIDER_SITE_OTHER): Payer: Medicare Other | Admitting: Family Medicine

## 2020-08-08 ENCOUNTER — Other Ambulatory Visit: Payer: Self-pay

## 2020-08-08 ENCOUNTER — Ambulatory Visit
Admission: RE | Admit: 2020-08-08 | Discharge: 2020-08-08 | Disposition: A | Discharge status: Home / Self Care | Payer: Medicare Other | Attending: Family Medicine | Admitting: Family Medicine

## 2020-08-08 ENCOUNTER — Ambulatory Visit
Admission: RE | Admit: 2020-08-08 | Discharge: 2020-08-08 | Disposition: A | Discharge status: Home / Self Care | Payer: Medicare Other | Source: Ambulatory Visit | Attending: Family Medicine | Admitting: Family Medicine

## 2020-08-08 ENCOUNTER — Encounter: Payer: Self-pay | Admitting: Family Medicine

## 2020-08-08 VITALS — BP 143/66 | HR 73 | Temp 98.0°F | Resp 16 | Ht 68.0 in | Wt 135.0 lb

## 2020-08-08 DIAGNOSIS — M79642 Pain in left hand: Secondary | ICD-10-CM

## 2020-08-08 DIAGNOSIS — M19042 Primary osteoarthritis, left hand: Secondary | ICD-10-CM | POA: Diagnosis not present

## 2020-08-08 DIAGNOSIS — Z86010 Personal history of colonic polyps: Secondary | ICD-10-CM | POA: Diagnosis not present

## 2020-08-08 DIAGNOSIS — E039 Hypothyroidism, unspecified: Secondary | ICD-10-CM | POA: Diagnosis not present

## 2020-08-08 DIAGNOSIS — E559 Vitamin D deficiency, unspecified: Secondary | ICD-10-CM | POA: Diagnosis not present

## 2020-08-08 DIAGNOSIS — E7800 Pure hypercholesterolemia, unspecified: Secondary | ICD-10-CM | POA: Diagnosis not present

## 2020-08-08 DIAGNOSIS — I2510 Atherosclerotic heart disease of native coronary artery without angina pectoris: Secondary | ICD-10-CM

## 2020-08-08 DIAGNOSIS — Z720 Tobacco use: Secondary | ICD-10-CM | POA: Diagnosis not present

## 2020-08-08 NOTE — Progress Notes (Signed)
Established patient visit   Patient: Hunter Oconnor   DOB: [birth year]   70 y.o. Male  MRN: [id number] Visit Date: 08/08/2020  Today's healthcare provider: Wilhemena Durie, MD   Chief Complaint  Patient presents with   Hypothyroidism   Hyperlipidemia   Subjective    Patient comes in today for follow-up.  He is cut way back on his smoking he says.  He is attempting to quit.  He has been on prednisone the past week for his ears but the past few days has had acute left hand discomfort and swelling.  No known trauma and is on the dorsum of the hand and the wrist. He did have a screening CT of the chest which was positive and follow-up bone scan was negative.  This was from May and they recommended follow-up CT scan which would be 6 months from previous. Hypothyroid, follow-up  Lab Results  Component Value Date   TSH 14.700 (H) 02/09/2020   TSH 2.490 10/05/2019   TSH 1.500 10/22/2017   Wt Readings from Last 3 Encounters:  08/08/20 135 lb (61.2 kg)  03/06/20 144 lb (65.3 kg)  02/06/20 143 lb (64.9 kg)    He was last seen for hypothyroid 6 months ago.  Management since that visit includes no medication changes. He reports good compliance with treatment. He is not having side effects.   Symptoms: No change in energy level No constipation  No diarrhea No heat / cold intolerance  No nervousness No palpitations  No weight changes    ----------------------------------------------------------------------------------------- Lipid/Cholesterol, Follow-up  Last lipid panel Other pertinent labs  Lab Results  Component Value Date   CHOL 160 02/09/2020   HDL 47 02/09/2020   LDLCALC 94 02/09/2020   TRIG 103 02/09/2020   CHOLHDL 3.0 10/05/2019   Lab Results  Component Value Date   ALT 13 02/09/2020   AST 14 02/09/2020   PLT 114 (L) 02/09/2020   TSH 14.700 (H) 02/09/2020     He was last seen for this 6 months ago.  Management since that visit includes no medication  changes.  He reports good compliance with treatment. He is not having side effects.   Symptoms: No chest pain No chest pressure/discomfort  No dyspnea No lower extremity edema  No numbness or tingling of extremity No orthopnea  No palpitations No paroxysmal nocturnal dyspnea  No speech difficulty No syncope   Current diet: well balanced Current exercise: no regular exercise  The 10-year ASCVD risk score Mikey Bussing DC Jr., et al., 2013) is: 25.1%       Medications: Outpatient Medications Prior to Visit  Medication Sig   fluticasone (FLOVENT HFA) 110 MCG/ACT inhaler Inhale 2 puffs into the lungs daily.   levothyroxine (SYNTHROID) 150 MCG tablet TAKE 1 TABLET DAILY   pravastatin (PRAVACHOL) 40 MG tablet Take 1 tablet (40 mg total) by mouth daily.   traZODone (DESYREL) 100 MG tablet TAKE 1 TABLET AT BEDTIME   zinc gluconate 50 MG tablet Take 50 mg by mouth daily.   varenicline (CHANTIX) 1 MG tablet Take one 0.5 mg tablet by mouth once daily for 3 days, then increase to one 0.5 mg tablet twice daily for 4 days, then increase to one 1 mg tablet twice daily.   No facility-administered medications prior to visit.    Review of Systems  Constitutional:  Negative for activity change and fatigue.  Cardiovascular:  Negative for chest pain, palpitations and leg swelling.  Musculoskeletal:  Positive  for arthralgias.  Neurological:  Negative for dizziness, light-headedness and headaches.       Objective    BP (!) 143/66   Pulse 73   Temp 98 F (36.7 C)   Resp 16   Ht 5\' 8"  (1.727 m)   Wt 135 lb (61.2 kg)   BMI 20.53 kg/m  BP Readings from Last 3 Encounters:  08/08/20 (!) 143/66  02/06/20 132/76  12/11/19 (!) 147/85   Wt Readings from Last 3 Encounters:  08/08/20 135 lb (61.2 kg)  03/06/20 144 lb (65.3 kg)  02/06/20 143 lb (64.9 kg)       Physical Exam Vitals reviewed.  Constitutional:      Appearance: He is well-developed.  HENT:     Head: Normocephalic and atraumatic.      Right Ear: External ear normal.     Left Ear: External ear normal.     Nose: Nose normal.  Eyes:     Conjunctiva/sclera: Conjunctivae normal.     Pupils: Pupils are equal, round, and reactive to light.  Cardiovascular:     Rate and Rhythm: Normal rate and regular rhythm.     Heart sounds: Normal heart sounds.  Pulmonary:     Effort: Pulmonary effort is normal.     Breath sounds: Normal breath sounds.  Abdominal:     General: Bowel sounds are normal.     Palpations: Abdomen is soft.  Musculoskeletal:     Cervical back: Normal range of motion and neck supple.     Comments: Mild swelling of the proximal dorsum of the hand/left wrist without any warmth or tenderness or fluctuance  Skin:    General: Skin is warm and dry.  Neurological:     General: No focal deficit present.     Mental Status: He is alert and oriented to person, place, and time.  Psychiatric:        Mood and Affect: Mood normal.        Behavior: Behavior normal.        Thought Content: Thought content normal.        Judgment: Judgment normal.      No results found for any visits on 08/08/20.  Assessment & Plan     1. ASCVD (arteriosclerotic cardiovascular disease) All risk factors treated.  ASCVD noted on CT scan.  2. Acquired hypothyroidism Follow-up TSH  3. Hypercholesteremia On pravastatin  4. Tobacco abuse disorder Patient strongly encouraged to discontinue smoking.  CT scan scheduled later this year for follow-up  5. History of colon polyps Refer back to GI Dr. Vicente Males - Ambulatory referral to Gastroenterology  6. Left hand pain Obtain x-ray.  Most likely arthritic - DG Hand Complete Left; Future  7. Avitaminosis D    No follow-ups on file.      I, Wilhemena Durie, MD, have reviewed all documentation for this visit. The documentation on 08/14/20 for the exam, diagnosis, procedures, and orders are all accurate and complete.    Novalyn Lajara Cranford Mon, MD  [facility] [phone] (phone) [phone] (fax)  [place]

## 2020-08-13 ENCOUNTER — Other Ambulatory Visit: Payer: Self-pay

## 2020-08-13 DIAGNOSIS — H6501 Acute serous otitis media, right ear: Secondary | ICD-10-CM | POA: Diagnosis not present

## 2020-08-13 MED ORDER — NA SULFATE-K SULFATE-MG SULF 17.5-3.13-1.6 GM/177ML PO SOLN: 354.0000 mL | Freq: Once | ORAL | 0 refills | Status: AC

## 2020-08-17 ENCOUNTER — Other Ambulatory Visit: Payer: Self-pay | Admitting: Family Medicine

## 2020-08-17 DIAGNOSIS — G4700 Insomnia, unspecified: Secondary | ICD-10-CM

## 2020-08-18 NOTE — Telephone Encounter (Signed)
last RF 11/01/19 #90 with 3 RF

## 2020-08-27 ENCOUNTER — Ambulatory Visit: Payer: Medicare Other | Admitting: Anesthesiology

## 2020-08-27 ENCOUNTER — Encounter
Admission: RE | Disposition: A | Discharge status: Home / Self Care | Payer: Self-pay | Source: Home / Self Care | Attending: Gastroenterology

## 2020-08-27 ENCOUNTER — Ambulatory Visit
Admission: RE | Admit: 2020-08-27 | Discharge: 2020-08-27 | Disposition: A | Discharge status: Home / Self Care | Payer: Medicare Other | Attending: Gastroenterology | Admitting: Gastroenterology

## 2020-08-27 ENCOUNTER — Encounter: Payer: Self-pay | Admitting: Gastroenterology

## 2020-08-27 DIAGNOSIS — Z1211 Encounter for screening for malignant neoplasm of colon: Secondary | ICD-10-CM | POA: Diagnosis not present

## 2020-08-27 DIAGNOSIS — Z86010 Personal history of colonic polyps: Secondary | ICD-10-CM | POA: Diagnosis not present

## 2020-08-27 DIAGNOSIS — K635 Polyp of colon: Secondary | ICD-10-CM | POA: Diagnosis not present

## 2020-08-27 DIAGNOSIS — Z79899 Other long term (current) drug therapy: Secondary | ICD-10-CM | POA: Diagnosis not present

## 2020-08-27 DIAGNOSIS — D120 Benign neoplasm of cecum: Secondary | ICD-10-CM | POA: Insufficient documentation

## 2020-08-27 DIAGNOSIS — F17210 Nicotine dependence, cigarettes, uncomplicated: Secondary | ICD-10-CM | POA: Diagnosis not present

## 2020-08-27 DIAGNOSIS — D122 Benign neoplasm of ascending colon: Secondary | ICD-10-CM | POA: Diagnosis not present

## 2020-08-27 HISTORY — PX: COLONOSCOPY: SHX5424

## 2020-08-27 SURGERY — COLONOSCOPY
Anesthesia: General

## 2020-08-27 MED ORDER — LIDOCAINE HCL (CARDIAC) PF 100 MG/5ML IV SOSY
PREFILLED_SYRINGE | INTRAVENOUS | Status: DC | PRN
  Administered 2020-08-27: 100 mg via INTRAVENOUS

## 2020-08-27 MED ORDER — PROPOFOL 10 MG/ML IV BOLUS
INTRAVENOUS | Status: DC | PRN
  Administered 2020-08-27: 10 mg via INTRAVENOUS
  Administered 2020-08-27: 50 mg via INTRAVENOUS

## 2020-08-27 MED ORDER — SODIUM CHLORIDE 0.9 % IV SOLN: INTRAVENOUS | Status: DC

## 2020-08-27 MED ORDER — PROPOFOL 500 MG/50ML IV EMUL
INTRAVENOUS | Status: DC | PRN
  Administered 2020-08-27: 155 ug/kg/min via INTRAVENOUS

## 2020-08-27 NOTE — Op Note (Signed)
[facility] Patient Name: Hunter Oconnor Procedure Date: 08/27/2020 9:02 AM MRN: [id number] Account #: [id number] Date of Birth: 25-Jun-1950 Admit Type: Outpatient Age: 70 Room: [facility] ENDO ROOM 2 Gender: Male Note Status: Finalized Procedure:             Colonoscopy Indications:           Surveillance: Personal history of piecemeal removal of                         adenoma on last colonoscopy 6 months ago, Last                         colonoscopy: November 2021 Providers:             Jonathon Bellows MD, MD Referring MD:          Janine Ores. Rosanna Randy, MD (Referring MD) Medicines:             Monitored Anesthesia Care Complications:         No immediate complications. Procedure:             Pre-Anesthesia Assessment:                        - Prior to the procedure, a History and Physical was                         performed, and patient medications, allergies and                         sensitivities were reviewed. The patient's tolerance                         of previous anesthesia was reviewed.                        - The risks and benefits of the procedure and the                         sedation options and risks were discussed with the                         patient. All questions were answered and informed                         consent was obtained.                        - ASA Grade Assessment: II - A patient with mild                         systemic disease.                        After obtaining informed consent, the colonoscope was                         passed under direct vision. Throughout the procedure,                         the patient's blood pressure, pulse,  and oxygen                         saturations were monitored continuously. The                         Colonoscope was introduced through the anus and                         advanced to the the cecum, identified by the                         appendiceal orifice. The  colonoscopy was performed                         with ease. The patient tolerated the procedure well.                         The quality of the bowel preparation was poor. Findings:      The perianal and digital rectal examinations were normal.      A 8 mm polyp was found in the appendiceal orifice. The polyp was       sessile. Polypectomy was attempted, initially using a cold snare. Polyp       resection was incomplete with this device. This intervention then       required a different device and polypectomy technique. The polyp was       removed with a cold biopsy forceps. Resection and retrieval were       complete.      Two sessile polyps were found in the cecum. The polyps were 4 to 5 mm in       size. These polyps were removed with a cold snare. Resection and       retrieval were complete.      A 4 mm polyp was found in the ascending colon. The polyp was sessile.       The polyp was removed with a cold snare. Resection and retrieval were       complete.      The exam was otherwise without abnormality on direct and retroflexion       views. Impression:            - Preparation of the colon was poor.                        - One 8 mm polyp at the appendiceal orifice, removed                         with a cold biopsy forceps. Resected and retrieved.                        - Two 4 to 5 mm polyps in the cecum, removed with a                         cold snare. Resected and retrieved.                        - One 4 mm polyp in the ascending colon, removed with  a cold snare. Resected and retrieved.                        - The examination was otherwise normal on direct and                         retroflexion views. Recommendation:        - Discharge patient to home (with escort).                        - Resume previous diet.                        - Continue present medications.                        - Await pathology results.                        -  Repeat colonoscopy in 6 months because the bowel                         preparation was suboptimal. Procedure Code(s):     --- Professional ---                        [id number], Colonoscopy, flexible; with removal of                         tumor(s), polyp(s), or other lesion(s) by snare                         technique                        45380, 57, Colonoscopy, flexible; with biopsy, single                         or multiple Diagnosis Code(s):     --- Professional ---                        K63.5, Polyp of colon                        Z09, Encounter for follow-up examination after                         completed treatment for conditions other than                         malignant neoplasm                        Z86.010, Personal history of colonic polyps CPT copyright 2019 American Medical Association. All rights reserved. The codes documented in this report are preliminary and upon coder review may  be revised to meet current compliance requirements. Jonathon Bellows, MD Jonathon Bellows MD, MD 08/27/2020 9:32:19 AM This report has been signed electronically. Number of Addenda: 0 Note Initiated On: 08/27/2020 9:02 AM Scope Withdrawal Time: 0 hours 13 minutes 39 seconds  Total Procedure Duration: 0 hours 18 minutes 53 seconds  Estimated Blood Loss:  Estimated blood loss: none.      [facility]

## 2020-08-27 NOTE — Anesthesia Postprocedure Evaluation (Signed)
Anesthesia Post Note  Patient: ONECIMO PURCHASE  Procedure(s) Performed: COLONOSCOPY  Patient location during evaluation: Endoscopy Anesthesia Type: General Level of consciousness: awake and alert and oriented Pain management: pain level controlled Vital Signs Assessment: post-procedure vital signs reviewed and stable Respiratory status: spontaneous breathing, nonlabored ventilation and respiratory function stable Cardiovascular status: blood pressure returned to baseline and stable Postop Assessment: no signs of nausea or vomiting Anesthetic complications: no   No notable events documented.   Last Vitals:  Vitals:   08/27/20 0941 08/27/20 0951  BP: (!) 144/88 (!) 161/87  Pulse: (!) 54 (!) 58  Resp: 15 15  Temp: (!) 36.1 C   SpO2: 99% 98%    Last Pain:  Vitals:   08/27/20 0951  TempSrc:   PainSc: 0-No pain                 Kaileigh Viswanathan

## 2020-08-27 NOTE — H&P (Signed)
Jonathon Bellows, MD [address] [place], [place], [place], [address] [address], [place], [place], [place], [address] Phone: [phone]  Fax: [phone]  Primary Care Physician:  Jerrol Banana., MD   Pre-Procedure History & Physical: HPI:  Hunter Oconnor is a 70 y.o. male is here for an colonoscopy.   Past Medical History:  Diagnosis Date   COPD (chronic obstructive pulmonary disease) ([place])    Hernia of abdominal wall    Hyperlipidemia    Hypothyroid    Insomnia     Past Surgical History:  Procedure Laterality Date   APPENDECTOMY     BACK SURGERY     neck     CERVICAL DISC SURGERY  2008   Ruptured disc   COLONOSCOPY WITH PROPOFOL N/A 12/11/2019   Procedure: COLONOSCOPY WITH PROPOFOL;  Surgeon: Jonathon Bellows, MD;  Location: [facility] ENDOSCOPY;  Service: Gastroenterology;  Laterality: N/A;   HERNIA REPAIR     INGUINAL HERNIA REPAIR Right    SHOULDER SURGERY     XI ROBOTIC ASSISTED INGUINAL HERNIA REPAIR WITH MESH Left 11/10/2019   Procedure: XI ROBOTIC ASSISTED INGUINAL HERNIA REPAIR WITH MESH;  Surgeon: Ronny Bacon, MD;  Location: [facility] ORS;  Service: General;  Laterality: Left;    Prior to Admission medications   Medication Sig Start Date End Date Taking? Authorizing Provider  fluticasone (FLOVENT HFA) 110 MCG/ACT inhaler Inhale 2 puffs into the lungs daily. 04/23/20  Yes Jerrol Banana., MD  levothyroxine (SYNTHROID) 150 MCG tablet TAKE 1 TABLET DAILY 05/07/20  Yes Jerrol Banana., MD  pravastatin (PRAVACHOL) 40 MG tablet Take 1 tablet (40 mg total) by mouth daily. 09/28/19  Yes Jerrol Banana., MD  traZODone (DESYREL) 100 MG tablet TAKE 1 TABLET AT BEDTIME 11/01/19   Jerrol Banana., MD  varenicline (CHANTIX) 1 MG tablet Take one 0.5 mg tablet by mouth once daily for 3 days, then increase to one 0.5 mg tablet twice daily for 4 days, then increase to one 1 mg tablet twice daily. Patient not taking: Reported on 08/27/2020 02/27/20    Jerrol Banana., MD  zinc gluconate 50 MG tablet Take 50 mg by mouth daily. Patient not taking: Reported on 08/27/2020    [provider]    Allergies as of 08/13/2020   (No Known Allergies)    Family History  Problem Relation Age of Onset   Cancer Mother        colon ca   Hypertension Father    Cancer Father     Social History   Socioeconomic History   Marital status: Married    Spouse name: Not on file   Number of children: 1   Years of education: Not on file   Highest education level: Associate degree: occupational, Hotel manager, or vocational program  Occupational History    Comment: part time  Tobacco Use   Smoking status: Every Day    Packs/day: 1.00    Years: 53.00    Pack years: 53.00    Types: Cigarettes   Smokeless tobacco: Never  Vaping Use   Vaping Use: Never used  Substance and Sexual Activity   Alcohol use: Yes    Comment: 1-2 drinks on the weekends   Drug use: No   Sexual activity: Not on file  Other Topics Concern   Not on file  Social History Narrative   Not on file   Social Determinants of Health   Financial Resource Strain: Low  Risk    Difficulty of Paying Living Expenses: Not hard at all  Food Insecurity: No Food Insecurity   Worried About [place] in the Last Year: Never true   Ran Out of Food in the Last Year: Never true  Transportation Needs: No Transportation Needs   Lack of Transportation (Medical): No   Lack of Transportation (Non-Medical): No  Physical Activity: Inactive   Days of Exercise per Week: 0 days   Minutes of Exercise per Session: 0 min  Stress: No Stress Concern Present   Feeling of Stress : Not at all  Social Connections: Moderately Isolated   Frequency of Communication with Friends and Family: More than three times a week   Frequency of Social Gatherings with Friends and Family: More than three times a week   Attends Religious Services: Never   Marine scientist or Organizations: No    Attends Music therapist: Never   Marital Status: Married  Human resources officer Violence: Not At Risk   Fear of Current or Ex-Partner: No   Emotionally Abused: No   Physically Abused: No   Sexually Abused: No    Review of Systems: See HPI, otherwise negative ROS  Physical Exam: BP 138/78   Pulse 66   Temp (!) 96.8 F (36 C) (Temporal)   Resp 16   Ht '5\' 8"'$  (1.727 m)   Wt 62.6 kg   SpO2 99%   BMI 20.98 kg/m  General:   Alert,  pleasant and cooperative in NAD Head:  Normocephalic and atraumatic. Neck:  Supple; no masses or thyromegaly. Lungs:  Clear throughout to auscultation, normal respiratory effort.    Heart:  +S1, +S2, Regular rate and rhythm, No edema. Abdomen:  Soft, nontender and nondistended. Normal bowel sounds, without guarding, and without rebound.   Neurologic:  Alert and  oriented x4;  grossly normal neurologically.  Impression/Plan: VINAL NOSBISCH is here for an colonoscopy to be performed for surveillance due to prior history of colon polyps   Risks, benefits, limitations, and alternatives regarding  colonoscopy have been reviewed with the patient.  Questions have been answered.  All parties agreeable.   Jonathon Bellows, MD  08/27/2020, 8:57 AM

## 2020-08-27 NOTE — Anesthesia Preprocedure Evaluation (Signed)
Anesthesia Evaluation  Patient identified by MRN, date of birth, ID band Patient awake    Reviewed: Allergy & Precautions, NPO status , Patient's Chart, lab work & pertinent test results  History of Anesthesia Complications Negative for: history of anesthetic complications  Airway Mallampati: II  TM Distance: >3 FB Neck ROM: Full    Dental  (+) Poor Dentition, Missing   Pulmonary COPD, Current SmokerPatient did not abstain from smoking.,    breath sounds clear to auscultation- rhonchi (-) wheezing      Cardiovascular Exercise Tolerance: Good (-) hypertension(-) CAD, (-) Past MI, (-) Cardiac Stents and (-) CABG  Rhythm:Regular Rate:Normal - Systolic murmurs and - Diastolic murmurs    Neuro/Psych neg Seizures negative neurological ROS  negative psych ROS   GI/Hepatic Neg liver ROS, GERD  ,  Endo/Other  neg diabetesHypothyroidism   Renal/GU negative Renal ROS     Musculoskeletal negative musculoskeletal ROS (+)   Abdominal (+) - obese,   Peds  Hematology negative hematology ROS (+)   Anesthesia Other Findings Past Medical History: No date: COPD (chronic obstructive pulmonary disease) (H[facility]) No date: Hernia of abdominal wall No date: Hyperlipidemia No date: Hypothyroid No date: Insomnia   Reproductive/Obstetrics                             Anesthesia Physical Anesthesia Plan  ASA: 2  Anesthesia Plan: General   Post-op Pain Management:    Induction: Intravenous  PONV Risk Score and Plan: 0 and Propofol infusion  Airway Management Planned: Natural Airway  Additional Equipment:   Intra-op Plan:   Post-operative Plan:   Informed Consent: I have reviewed the patients History and Physical, chart, labs and discussed the procedure including the risks, benefits and alternatives for the proposed anesthesia with the patient or authorized representative who has indicated his/her  understanding and acceptance.     Dental advisory given  Plan Discussed with: CRNA and Anesthesiologist  Anesthesia Plan Comments:         Anesthesia Quick Evaluation

## 2020-08-27 NOTE — Transfer of Care (Signed)
Immediate Anesthesia Transfer of Care Note  Patient: Hunter Oconnor  Procedure(s) Performed: COLONOSCOPY  Patient Location: Endoscopy Unit  Anesthesia Type:General  Level of Consciousness: drowsy and patient cooperative  Airway & Oxygen Therapy: Patient Spontanous Breathing and Patient connected to face mask oxygen  Post-op Assessment: Report given to RN and Post -op Vital signs reviewed and stable  Post vital signs: Reviewed and stable  Last Vitals:  Vitals Value Taken Time  BP 139/72 08/27/20 0931  Temp    Pulse 73 08/27/20 0931  Resp 18 08/27/20 0931  SpO2 98 % 08/27/20 0931  Vitals shown include unvalidated device data.  Last Pain:  Vitals:   08/27/20 0749  TempSrc: Temporal  PainSc: 0-No pain         Complications: No notable events documented.

## 2020-08-27 NOTE — Anesthesia Procedure Notes (Signed)
Procedure Name: General with mask airway Date/Time: 08/27/2020 9:08 AM Performed by: Kelton Pillar, CRNA Pre-anesthesia Checklist: Patient identified, Emergency Drugs available, Suction available and Patient being monitored Patient Re-evaluated:Patient Re-evaluated prior to induction Oxygen Delivery Method: Simple face mask Induction Type: IV induction Placement Confirmation: positive ETCO2 and CO2 detector Dental Injury: Teeth and Oropharynx as per pre-operative assessment

## 2020-08-28 ENCOUNTER — Encounter: Payer: Self-pay | Admitting: Gastroenterology

## 2020-08-29 LAB — SURGICAL PATHOLOGY

## 2020-08-31 ENCOUNTER — Other Ambulatory Visit: Payer: Self-pay | Admitting: Family Medicine

## 2020-08-31 DIAGNOSIS — E7800 Pure hypercholesterolemia, unspecified: Secondary | ICD-10-CM

## 2020-08-31 NOTE — Telephone Encounter (Signed)
Requested Prescriptions  Pending Prescriptions Disp Refills  . pravastatin (PRAVACHOL) 40 MG tablet [Pharmacy Med Name: PRAVASTATIN SODIUM 40 MG TAB] 90 tablet 1    Sig: TAKE 1 TABLET BY MOUTH EVERY DAY     Cardiovascular:  Antilipid - Statins Passed - 08/31/2020 11:35 AM      Passed - Total Cholesterol in normal range and within 360 days    Cholesterol, Total  Date Value Ref Range Status  02/09/2020 160 100 - 199 mg/dL Final         Passed - LDL in normal range and within 360 days    LDL Chol Calc (NIH)  Date Value Ref Range Status  02/09/2020 94 0 - 99 mg/dL Final         Passed - HDL in normal range and within 360 days    HDL  Date Value Ref Range Status  02/09/2020 47 >39 mg/dL Final         Passed - Triglycerides in normal range and within 360 days    Triglycerides  Date Value Ref Range Status  02/09/2020 103 0 - 149 mg/dL Final         Passed - Patient is not pregnant      Passed - Valid encounter within last 12 months    Recent Outpatient Visits          3 weeks ago ASCVD (arteriosclerotic cardiovascular disease)   [place] Jerrol Banana., MD   6 months ago Acquired hypothyroidism   [facility] Jerrol Banana., MD   11 months ago COPD exacerbation [facility])   [facility] Jerrol Banana., MD   2 years ago COPD exacerbation [facility])   [facility] Jerrol Banana., MD   2 years ago COPD exacerbation [facility])   [facility] Jerrol Banana., MD      Future Appointments            In 5 months Jerrol Banana., MD [facility]

## 2020-09-27 ENCOUNTER — Ambulatory Visit: Payer: Self-pay | Admitting: *Deleted

## 2020-09-27 NOTE — Telephone Encounter (Signed)
Patient is calling to make appointment- patient states he was working in the kitchen and hit the corner of the refrigerator with his tailbone- 1 week ago. Patient states he is very uncomfortable sitting- but otherwise he can walk normally. Patient is making appointment at urging of wife- he is 11/2 hours away from the office now. Call to office- appointment scheduled for Monday.

## 2020-09-27 NOTE — Telephone Encounter (Signed)
Pts wife, Mardene Celeste, calling on behalf of pt stating that the pt hit his tail bone on the refrigerator x 1 week ago. She states that there is no bruising, but lots of pain. Please advise.  Reason for Disposition  [1] After 3 days (72 hours) AND [2] pain not improving  Answer Assessment - Initial Assessment Questions 1. MECHANISM: "How did the injury happen?"       Patient hit the corner of the refrigerator will working in the kitchen 2. ONSET: "When did the injury happen?" (Minutes or hours ago)      Last week- Friday 3. LOCATION: "Where is the injury located?"      tailbone 4. SEVERITY: "Can you sit?" "Can you walk?"      No- sitting is painful, yes can walk 5. PAIN: "Is there pain?" If Yes, ask: "How bad is the pain?"    (Scale 1-10; or mild, moderate, severe)     moderate 6. SIZE: For bruises, or swelling, ask: "How large is it?" (e.g., inches or centimeters)      No bruising/swelling 7. OTHER SYMPTOMS: "Do you have any other symptoms?" (e.g., numbness, back pain)     no 8. PREGNANCY: "Is there any chance you are pregnant?" "When was your last menstrual period?"     N/a  Protocols used: Tailbone Injury-A-AH

## 2020-09-27 NOTE — Telephone Encounter (Signed)
FYI

## 2020-09-29 ENCOUNTER — Other Ambulatory Visit: Payer: Self-pay | Admitting: Family Medicine

## 2020-09-29 DIAGNOSIS — E039 Hypothyroidism, unspecified: Secondary | ICD-10-CM

## 2020-09-29 NOTE — Telephone Encounter (Signed)
Requested medication (s) are due for refill today: no  Requested medication (s) are on the active medication list: yes  Last refill:  05/07/20  Future visit scheduled: yes  Notes to clinic:  overdue TSH   Requested Prescriptions  Pending Prescriptions Disp Refills   levothyroxine (SYNTHROID) 150 MCG tablet [Pharmacy Med Name: LEVOTHYROXIN TAB 150MCG] 90 tablet 1    Sig: TAKE 1 TABLET DAILY     Endocrinology:  Hypothyroid Agents Failed - 09/29/2020  5:15 PM      Failed - TSH needs to be rechecked within 3 months after an abnormal result. Refill until TSH is due.      Failed - TSH in normal range and within 360 days    TSH  Date Value Ref Range Status  02/09/2020 14.700 (H) 0.450 - 4.500 uIU/mL Final          Passed - Valid encounter within last 12 months    Recent Outpatient Visits           1 month ago ASCVD (arteriosclerotic cardiovascular disease)   [place] Jerrol Banana., MD   7 months ago Acquired hypothyroidism   [facility] Jerrol Banana., MD   1 year ago COPD exacerbation [facility])   [facility] Jerrol Banana., MD   2 years ago COPD exacerbation [facility])   [facility] Jerrol Banana., MD   2 years ago COPD exacerbation [facility])   [facility] Jerrol Banana., MD       Future Appointments             In 4 months Jerrol Banana., MD [facility] [place]

## 2020-09-30 ENCOUNTER — Other Ambulatory Visit: Payer: Self-pay

## 2020-09-30 ENCOUNTER — Encounter: Payer: Self-pay | Admitting: Family Medicine

## 2020-09-30 ENCOUNTER — Ambulatory Visit (INDEPENDENT_AMBULATORY_CARE_PROVIDER_SITE_OTHER): Payer: Medicare Other | Admitting: Family Medicine

## 2020-09-30 VITALS — BP 162/89 | HR 59 | Temp 98.1°F | Resp 16 | Wt 129.2 lb

## 2020-09-30 DIAGNOSIS — E039 Hypothyroidism, unspecified: Secondary | ICD-10-CM

## 2020-09-30 DIAGNOSIS — E7800 Pure hypercholesterolemia, unspecified: Secondary | ICD-10-CM

## 2020-09-30 DIAGNOSIS — I10 Essential (primary) hypertension: Secondary | ICD-10-CM | POA: Insufficient documentation

## 2020-09-30 DIAGNOSIS — G4700 Insomnia, unspecified: Secondary | ICD-10-CM

## 2020-09-30 DIAGNOSIS — L409 Psoriasis, unspecified: Secondary | ICD-10-CM

## 2020-09-30 DIAGNOSIS — Z720 Tobacco use: Secondary | ICD-10-CM

## 2020-09-30 DIAGNOSIS — S3992XS Unspecified injury of lower back, sequela: Secondary | ICD-10-CM | POA: Diagnosis not present

## 2020-09-30 MED ORDER — LEVOTHYROXINE SODIUM 150 MCG PO TABS: 150.0000 ug | ORAL_TABLET | Freq: Every day | ORAL | 3 refills | Status: DC

## 2020-09-30 MED ORDER — TRAZODONE HCL 100 MG PO TABS: 100.0000 mg | ORAL_TABLET | Freq: Every day | ORAL | 3 refills | Status: DC

## 2020-09-30 MED ORDER — MELOXICAM 15 MG PO TABS: 15.0000 mg | ORAL_TABLET | Freq: Every day | ORAL | 0 refills | Status: DC

## 2020-09-30 MED ORDER — PRAVASTATIN SODIUM 40 MG PO TABS: 40.0000 mg | ORAL_TABLET | Freq: Every day | ORAL | 3 refills | Status: DC

## 2020-09-30 NOTE — Assessment & Plan Note (Signed)
Hit coccyx upon bending down in kitchen Has improved since initial encounter

## 2020-09-30 NOTE — Assessment & Plan Note (Signed)
Pulled for medication refill 

## 2020-09-30 NOTE — Assessment & Plan Note (Signed)
In acute pain today d/t coccyx injury Also nicotine withdrawal- as without cigarette x5 days Will continue to manage at routine intervals

## 2020-09-30 NOTE — Progress Notes (Signed)
Established patient visit   Patient: Hunter Oconnor   DOB: 06/15/1950   70 y.o. Male  MRN: [id number] Visit Date: 09/30/2020  Today's healthcare provider: Gwyneth Sprout, FNP   Chief Complaint  Patient presents with   Tailbone Pain   Subjective  -------------------------------------------------------------------------------------------------------------------- Back Pain This is a new problem. The current episode started in the past 7 days. The problem occurs constantly. The pain is present in the gluteal. The quality of the pain is described as aching and stabbing. The pain does not radiate. The symptoms are aggravated by sitting. Pertinent negatives include no abdominal pain, bladder incontinence, bowel incontinence, chest pain, dysuria, fever, headaches, leg pain, numbness, paresis, paresthesias, pelvic pain, perianal numbness, tingling, weakness or weight loss. Risk factors include recent trauma. He has tried nothing for the symptoms.    Denies constipation  Medications: Outpatient Medications Prior to Visit  Medication Sig   fluticasone (FLOVENT HFA) 110 MCG/ACT inhaler Inhale 2 puffs into the lungs daily.   [DISCONTINUED] levothyroxine (SYNTHROID) 150 MCG tablet TAKE 1 TABLET DAILY   [DISCONTINUED] pravastatin (PRAVACHOL) 40 MG tablet TAKE 1 TABLET BY MOUTH EVERY DAY   [DISCONTINUED] traZODone (DESYREL) 100 MG tablet TAKE 1 TABLET AT BEDTIME   [DISCONTINUED] varenicline (CHANTIX) 1 MG tablet Take one 0.5 mg tablet by mouth once daily for 3 days, then increase to one 0.5 mg tablet twice daily for 4 days, then increase to one 1 mg tablet twice daily. (Patient not taking: Reported on 08/27/2020)   [DISCONTINUED] zinc gluconate 50 MG tablet Take 50 mg by mouth daily. (Patient not taking: Reported on 08/27/2020)   No facility-administered medications prior to visit.    Review of Systems  Constitutional:  Negative for fever and weight loss.  Cardiovascular:  Negative for chest  pain.  Gastrointestinal:  Negative for abdominal pain and bowel incontinence.  Genitourinary:  Negative for bladder incontinence, dysuria and pelvic pain.  Musculoskeletal:  Positive for back pain.  Neurological:  Negative for tingling, weakness, numbness, headaches and paresthesias.    Objective  -------------------------------------------------------------------------------------------------------------------- BP (!) 162/89   Pulse (!) 59   Temp 98.1 F (36.7 C) (Oral)   Resp 16   Wt 129 lb 3.2 oz (58.6 kg)   BMI 19.64 kg/m  Physical Exam Vitals and nursing note reviewed.  Constitutional:      General: He is not in acute distress.    Appearance: Normal appearance. He is ill-appearing. He is not toxic-appearing or diaphoretic.  HENT:     Head: Normocephalic and atraumatic.  Cardiovascular:     Rate and Rhythm: Normal rate and regular rhythm.     Pulses: Normal pulses.     Heart sounds: Normal heart sounds. No murmur heard.   No friction rub. No gallop.  Pulmonary:     Effort: Pulmonary effort is normal. No respiratory distress.     Breath sounds: Normal breath sounds. No stridor. No wheezing, rhonchi or rales.  Chest:     Chest wall: No tenderness.  Abdominal:     General: Abdomen is flat.  Musculoskeletal:        General: No swelling, tenderness, deformity or signs of injury. Normal range of motion.     Cervical back: Normal range of motion.       Legs:     Comments: Point tenderness to coccyx  No soft tissue injury No skin breakdown Dry skin s/s psoriasis   Skin:    General: Skin is warm and dry.  Capillary Refill: Capillary refill takes less than 2 seconds.     Coloration: Skin is not jaundiced or pale.     Findings: No bruising or erythema.  Neurological:     General: No focal deficit present.     Mental Status: He is alert and oriented to person, place, and time. Mental status is at baseline.     Cranial Nerves: No cranial nerve deficit.     Sensory: No  sensory deficit.     Motor: No weakness.     Coordination: Coordination normal.  Psychiatric:        Attention and Perception: Attention normal.        Mood and Affect: Affect normal. Mood is anxious.        Speech: Speech normal.        Behavior: Behavior normal.        Thought Content: Thought content normal.        Cognition and Memory: Cognition normal.        Judgment: Judgment normal.     Comments: Patient trying to quit smoking- has been without a cigarette in 5 days, longest period of time in 50 years    Re Patient is ill-appearing.  Patient is uncomfortable in seated position.  No results found for any visits on 09/30/20.  Assessment & Plan  --------------------------------------------------------------------------------------------------------------------- Problem List Items Addressed This Visit       Cardiovascular and Mediastinum   Primary hypertension    In acute pain today d/t coccyx injury Also nicotine withdrawal- as without cigarette x5 days Will continue to manage at routine intervals      Relevant Medications   pravastatin (PRAVACHOL) 40 MG tablet     Endocrine   Acquired hypothyroidism    Pulled for medication refill      Relevant Medications   levothyroxine (SYNTHROID) 150 MCG tablet     Musculoskeletal and Integument   Psoriasis    Noted at gluteal cleft Also scattered lesions noted over entire body        Other   Hypercholesteremia    Pulled for medication refill      Relevant Medications   pravastatin (PRAVACHOL) 40 MG tablet   Cannot sleep    Pulled for medication refill      Relevant Medications   traZODone (DESYREL) 100 MG tablet   Tobacco abuse disorder    Currently trying to quit smoking Last cigarette on Thursday Not using any smoking cessation aides Congratulated on time so far Notes that this is the longest he's been without a cigarette in >50 years      Tailbone injury, sequela - Primary    Hit coccyx upon bending  down in kitchen Has improved since initial encounter      Relevant Medications   meloxicam (MOBIC) 15 MG tablet     Return if symptoms worsen or fail to improve.      Vonna Kotyk, FNP, have reviewed all documentation for this visit. The documentation on 09/30/20 for the exam, diagnosis, procedures, and orders are all accurate and complete.    Gwyneth Sprout, [place] [phone] (phone) [phone] (fax)  [place]

## 2020-09-30 NOTE — Assessment & Plan Note (Signed)
Currently trying to quit smoking Last cigarette on Thursday Not using any smoking cessation aides Congratulated on time so far Notes that this is the longest he's been without a cigarette in >50 years

## 2020-09-30 NOTE — Assessment & Plan Note (Signed)
Noted at gluteal cleft Also scattered lesions noted over entire body

## 2020-10-22 ENCOUNTER — Ambulatory Visit: Payer: Medicare Other

## 2020-10-26 ENCOUNTER — Ambulatory Visit (INDEPENDENT_AMBULATORY_CARE_PROVIDER_SITE_OTHER): Payer: Medicare Other

## 2020-10-26 DIAGNOSIS — Z0000 Encounter for general adult medical examination without abnormal findings: Secondary | ICD-10-CM

## 2020-10-26 NOTE — Progress Notes (Signed)
Subjective:   Hunter Oconnor is a 70 y.o. male who presents for Medicare Annual/Subsequent preventive examination.  I connected with  Hunter Oconnor on 10/26/20 by an audio only telemedicine application and verified that I am speaking with the correct person using two identifiers.   I discussed the limitations, risks, security and privacy concerns of performing an evaluation and management service by telephone and the availability of in person appointments. I also discussed with the patient that there may be a patient responsible charge related to this service. The patient expressed understanding and verbally consented to this telephonic visit.  Location of Patient: HOME Location of Provider: OFFICE  List any persons and their role that are participating in the visit with the patient. No other persons assisted with the visit.     Review of Systems     Defer for PCP       Objective:    Today's Vitals   10/26/20 0815  PainSc: 2    There is no height or weight on file to calculate BMI.  Advanced Directives 08/27/2020 12/11/2019 11/10/2019 11/03/2019 10/17/2019 03/26/2015  Does Patient Have a Medical Advance Directive? Yes Yes Yes Yes Yes Yes  Type of Paramedic of [place];Living will Living will Living will Living will [place];Living will [place];Living will  Does patient want to make changes to medical advance directive? - - No - Patient declined - - -  Copy of [place] in Chart? - - - - No - copy requested -    Current Medications (verified) Outpatient Encounter Medications as of 10/26/2020  Medication Sig   fluticasone (FLOVENT HFA) 110 MCG/ACT inhaler Inhale 2 puffs into the lungs daily.   levothyroxine (SYNTHROID) 150 MCG tablet Take 1 tablet (150 mcg total) by mouth daily.   pravastatin (PRAVACHOL) 40 MG tablet Take 1 tablet (40 mg total) by mouth daily.   traZODone (DESYREL) 100 MG tablet  Take 1 tablet (100 mg total) by mouth at bedtime.   meloxicam (MOBIC) 15 MG tablet Take 1 tablet (15 mg total) by mouth daily. (Patient not taking: Reported on 10/26/2020)   No facility-administered encounter medications on file as of 10/26/2020.    Allergies (verified) Patient has no known allergies.   History: Past Medical History:  Diagnosis Date   COPD (chronic obstructive pulmonary disease) ([place])    Hernia of abdominal wall    Hyperlipidemia    Hypothyroid    Insomnia    Past Surgical History:  Procedure Laterality Date   APPENDECTOMY     BACK SURGERY     neck     CERVICAL DISC SURGERY  2008   Ruptured disc   COLONOSCOPY N/A 08/27/2020   Procedure: COLONOSCOPY;  Surgeon: Hunter Bellows, MD;  Location[facility] END[facility]COPY;  Service: Gastroenterology;  Laterality: N/A;   COLONOSCOPY WITH PROPOFOL N/A 12/11/2019   Procedure: COLONOSCOPY WITH PROPOFOL;  Surgeon: Hunter Bellows, MD;  Location[facility] ENDOSCOPY;  Service: Gastroenterology;  Laterality: N/A;   HERNIA REPAIR     INGUINAL HERNIA REPAIR Right    SHOULDER SURGERY     XI ROBOTIC ASSISTED INGUINAL HERNIA REPAIR WITH MESH Left 11/10/2019   Procedure: XI ROBOTIC ASSISTED INGUINAL HERNIA REPAIR WITH MESH;  Surgeon: Hunter Bacon, MD;  Location: [facility] ORS;  Service: General;  Laterality: Left;   Family History  Problem Relation Age of Onset   Cancer Mother        colon ca  Hypertension Father    Cancer Father    Social History   Socioeconomic History   Marital status: Married    Spouse name: Hunter Oconnor   Number of children: 1   Years of education: 14   Highest education level: Associate degree: occupational, Hotel manager, or vocational program  Occupational History    Comment: part time   Occupation: Part Time Visual merchandiser: Hunter Oconnor  Tobacco Use   Smoking status: Every Day    Packs/day: 1.00    Years: 53.00    Pack years: 53.00    Types: Cigarettes   Smokeless tobacco: Never  Vaping Use   Vaping Use: Never  used  Substance and Sexual Activity   Alcohol use: Yes    Comment: 1-2 drinks on the weekends   Drug use: No   Sexual activity: Yes  Other Topics Concern   Not on file  Social History Narrative   Not on file   Social Determinants of Health   Financial Resource Strain: Low Risk    Difficulty of Paying Living Expenses: Not hard at all  Food Insecurity: No Food Insecurity   Worried About Charity fundraiser in the Last Year: Never true   Ran Out of Food in the Last Year: Never true  Transportation Needs: Not on file  Physical Activity: Sufficiently Active   Days of Exercise per Week: 7 days   Minutes of Exercise per Session: 30 min  Stress: No Stress Concern Present   Feeling of Stress : Not at all  Social Connections: Moderately Isolated   Frequency of Communication with Friends and Family: Never   Frequency of Social Gatherings with Friends and Family: More than three times a week   Attends Religious Services: Never   Marine scientist or Organizations: No   Attends Music therapist: Never   Marital Status: Married    Tobacco Counseling Ready to quit: Yes Counseling given: No   Clinical Intake:  Pre-visit preparation completed: Yes  Pain : 0-10 Pain Score: 2  Pain Type: Chronic pain Pain Location: Hip Pain Orientation: Right, Left Pain Radiating Towards: Pain does radiate down right leg more than left leg. Pain Descriptors / Indicators: Burning Pain Onset: More than a month ago Pain Frequency: Occasional Pain Relieving Factors: Walking helps to relieve the pain. Effect of Pain on Daily Activities: It does not affect ADL's  Pain Relieving Factors: Walking helps to relieve the pain.  Diabetes: No  How often do you need to have someone help you when you read instructions, pamphlets, or other written materials from your doctor or pharmacy?: 1 - Never What is the last grade level you completed in school?: 2 years of college  Diabetic?  NO  Interpreter Needed?: No  Information entered by :: Hunter Oconnor, [place]   Activities of Daily Living In your present state of health, do you have any difficulty performing the following activities: 02/06/2020 11/03/2019  Hearing? N N  Vision? N N  Comment - wears glasses  Difficulty concentrating or making decisions? N N  Walking or climbing stairs? Y N  Dressing or bathing? N N  Doing errands, shopping? N N  Some recent data might be hidden    Patient Care Team: Jerrol Banana., MD as PCP - General (Family Medicine) [place], [facility] Helene Shoe, Sharlet Salina, MD as Consulting Physician (General Surgery)  Indicate any recent Medical Services you may have received from other than [facility] providers  in the past year (date may be approximate).     Assessment:   This is a routine wellness examination for [place].  Hearing/Vision screen No results found.  Dietary issues and exercise activities discussed:     Goals Addressed   None   Depression Screen PHQ 2/9 Scores 10/26/2020 02/06/2020 10/17/2019 01/27/2018 04/09/2016  PHQ - 2 Score 0 0 0 0 0  PHQ- 9 Score - 2 - - -    Fall Risk Fall Risk  02/06/2020 10/17/2019 10/12/2019 01/27/2018 04/09/2016  Falls in the past year? 0 0 0 0 No  Number falls in past yr: 0 0 0 - -  Injury with Fall? 0 0 0 - -  Risk for fall due to : No Fall Risks - - - -  Follow up Falls evaluation completed - - - -    FALL RISK PREVENTION PERTAINING TO THE HOME:  Any stairs in or around the home? Yes  If so, are there any without handrails? No  Home free of loose throw rugs in walkways, pet beds, electrical cords, etc? No  Adequate lighting in your home to reduce risk of falls? Yes   ASSISTIVE DEVICES UTILIZED TO PREVENT FALLS:  Life alert? No  Use of a cane, walker or w/c? No  Grab bars in the bathroom? No  Shower chair or bench in shower? No  Elevated toilet seat or a handicapped toilet? No    Cognitive Function:         Immunizations Immunization History  Administered Date(s) Administered   Influenza Split 10/20/2007   Influenza,inj,Quad PF,6+ Mos 11/01/2013   Influenza-Unspecified 12/28/2014   PFIZER(Purple Top)SARS-COV-2 Vaccination 03/17/2019, 04/21/2019, 11/01/2019   Pneumococcal Conjugate-13 03/21/2014   Pneumococcal Polysaccharide-23 03/26/2015   Tdap 11/16/2011    TDAP status: Up to date  Flu Vaccine status: Due, Education has been provided regarding the importance of this vaccine. Advised may receive this vaccine at local pharmacy or Health Dept. Aware to provide a copy of the vaccination record if obtained from local pharmacy or Health Dept. Verbalized acceptance and understanding.  Pneumococcal vaccine status: Up to date  Covid-19 vaccine status: Information provided on how to obtain vaccines.   Qualifies for Shingles Vaccine? Yes   Zostavax completed No   Shingrix Completed?: No.    Education has been provided regarding the importance of this vaccine. Patient has been advised to call insurance company to determine out of pocket expense if they have not yet received this vaccine. Advised may also receive vaccine at local pharmacy or Health Dept. Verbalized acceptance and understanding.  Screening Tests Health Maintenance  Topic Date Due   Zoster Vaccines- Shingrix (1 of 2) Never done   COVID-19 Vaccine (4 - Booster for Pfizer series) 03/02/2020   INFLUENZA VACCINE  09/02/2020   TETANUS/TDAP  11/15/2021   COLONOSCOPY (Pts 45-56yrs Insurance coverage will need to be confirmed)  08/28/2030   Hepatitis C Screening  Completed   HPV VACCINES  Aged Out    Health Maintenance  Health Maintenance Due  Topic Date Due   Zoster Vaccines- Shingrix (1 of 2) Never done   COVID-19 Vaccine (4 - Booster for Pfizer series) 03/02/2020   INFLUENZA VACCINE  09/02/2020    Colorectal cancer screening: Type of screening: Colonoscopy. Completed 08/27/2020. Repeat every 5 years  Lung Cancer  Screening: (Low Dose CT Chest recommended if Age 67-80 years, 30 pack-year currently smoking OR have quit w/in 15years.) does qualify.   Lung Cancer Screening Referral: N/A  Additional  Screening:  Hepatitis C Screening: does qualify; Completed 01/03/2013 Vision Screening: Recommended annual ophthalmology exams for early detection of glaucoma and other disorders of the eye. Is the patient up to date with their annual eye exam?  Yes  Who is the provider or what is the name of the office in which the patient attends annual eye exams? [facility] If pt is not established with a provider, would they like to be referred to a provider to establish care?  N/A .   Dental Screening: Recommended annual dental exams for proper oral hygiene  Community Resource Referral / Chronic Care Management: CRR required this visit?  No   CCM required this visit?  No      Plan:     I have personally reviewed and noted the following in the patient's chart:   Medical and social history Use of alcohol, tobacco or illicit drugs  Current medications and supplements including opioid prescriptions. Patient is not currently taking opioid prescriptions. Functional ability and status Nutritional status Physical activity Advanced directives List of other physicians Hospitalizations, surgeries, and ER visits in previous 12 months Vitals Screenings to include cognitive, depression, and falls Referrals and appointments  In addition, I have reviewed and discussed with patient certain preventive protocols, quality metrics, and best practice recommendations. A written personalized care plan for preventive services as well as general preventive health recommendations were provided to patient.     Tawni Pummel, [place]   10/26/2020   Nurse Notes: non face to face visit, 45 minutes   Mr. Seidman , Thank you for taking time to come for your Medicare Wellness Visit. I appreciate your ongoing commitment to your  health goals. Please review the following plan we discussed and let me know if I can assist you in the future.   These are the goals we discussed:  Goals      Quit Smoking     Recommend to continue efforts to reduce smoking habits until no longer smoking.        This is a list of the screening recommended for you and due dates:  Health Maintenance  Topic Date Due   Zoster (Shingles) Vaccine (1 of 2) Never done   COVID-19 Vaccine (4 - Booster for Pfizer series) 03/02/2020   Flu Shot  09/02/2020   Tetanus Vaccine  11/15/2021   Colon Cancer Screening  08/28/2030   Hepatitis C Screening: USPSTF Recommendation to screen - Ages 18-79 yo.  Completed   HPV Vaccine  Aged Out

## 2020-10-26 NOTE — Patient Instructions (Signed)
Health Maintenance, Male Adopting a healthy lifestyle and getting preventive care are important in promoting health and wellness. Ask your health care provider about: The right schedule for you to have regular tests and exams. Things you can do on your own to prevent diseases and keep yourself healthy. What should I know about diet, weight, and exercise? Eat a healthy diet  Eat a diet that includes plenty of vegetables, fruits, low-fat dairy products, and lean protein. Do not eat a lot of foods that are high in solid fats, added sugars, or sodium. Maintain a healthy weight Body mass index (BMI) is a measurement that can be used to identify possible weight problems. It estimates body fat based on height and weight. Your health care provider can help determine your BMI and help you achieve or maintain a healthy weight. Get regular exercise Get regular exercise. This is one of the most important things you can do for your health. Most adults should: Exercise for at least 150 minutes each week. The exercise should increase your heart rate and make you sweat (moderate-intensity exercise). Do strengthening exercises at least twice a week. This is in addition to the moderate-intensity exercise. Spend less time sitting. Even light physical activity can be beneficial. Watch cholesterol and blood lipids Have your blood tested for lipids and cholesterol at 72 years of age, then have this test every 5 years. You may need to have your cholesterol levels checked more often if: Your lipid or cholesterol levels are high. You are older than 72 years of age. You are at high risk for heart disease. What should I know about cancer screening? Many types of cancers can be detected early and may often be prevented. Depending on your health history and family history, you may need to have cancer screening at various ages. This may include screening for: Colorectal cancer. Prostate cancer. Skin cancer. Lung  cancer. What should I know about heart disease, diabetes, and high blood pressure? Blood pressure and heart disease High blood pressure causes heart disease and increases the risk of stroke. This is more likely to develop in people who have high blood pressure readings, are of African descent, or are overweight. Talk with your health care provider about your target blood pressure readings. Have your blood pressure checked: Every 3-5 years if you are 18-39 years of age. Every year if you are 40 years old or older. If you are between the ages of 65 and 75 and are a current or former smoker, ask your health care provider if you should have a one-time screening for abdominal aortic aneurysm (AAA). Diabetes Have regular diabetes screenings. This checks your fasting blood sugar level. Have the screening done: Once every three years after age 45 if you are at a normal weight and have a low risk for diabetes. More often and at a younger age if you are overweight or have a high risk for diabetes. What should I know about preventing infection? Hepatitis B If you have a higher risk for hepatitis B, you should be screened for this virus. Talk with your health care provider to find out if you are at risk for hepatitis B infection. Hepatitis C Blood testing is recommended for: Everyone born from 1945 through 1965. Anyone with known risk factors for hepatitis C. Sexually transmitted infections (STIs) You should be screened each year for STIs, including gonorrhea and chlamydia, if: You are sexually active and are younger than 72 years of age. You are older than 72 years   of age and your health care provider tells you that you are at risk for this type of infection. Your sexual activity has changed since you were last screened, and you are at increased risk for chlamydia or gonorrhea. Ask your health care provider if you are at risk. Ask your health care provider about whether you are at high risk for HIV.  Your health care provider may recommend a prescription medicine to help prevent HIV infection. If you choose to take medicine to prevent HIV, you should first get tested for HIV. You should then be tested every 3 months for as long as you are taking the medicine. Follow these instructions at home: Lifestyle Do not use any products that contain nicotine or tobacco, such as cigarettes, e-cigarettes, and chewing tobacco. If you need help quitting, ask your health care provider. Do not use street drugs. Do not share needles. Ask your health care provider for help if you need support or information about quitting drugs. Alcohol use Do not drink alcohol if your health care provider tells you not to drink. If you drink alcohol: Limit how much you have to 0-2 drinks a day. Be aware of how much alcohol is in your drink. In the [place] one drink equals one 12 oz bottle of beer (355 mL), one 5 oz glass of wine (148 mL), or one 1 oz glass of hard liquor (44 mL). General instructions Schedule regular health, dental, and eye exams. Stay current with your vaccines. Tell your health care provider if: You often feel depressed. You have ever been abused or do not feel safe at home. Summary Adopting a healthy lifestyle and getting preventive care are important in promoting health and wellness. Follow your health care provider's instructions about healthy diet, exercising, and getting tested or screened for diseases. Follow your health care provider's instructions on monitoring your cholesterol and blood pressure. This information is not intended to replace advice given to you by your health care provider. Make sure you discuss any questions you have with your health care provider. Document Revised: 03/29/2020 Document Reviewed: 01/12/2018 Elsevier Patient Education  2022 Elsevier Inc.  

## 2020-10-27 ENCOUNTER — Other Ambulatory Visit: Payer: Self-pay | Admitting: Family Medicine

## 2020-10-27 DIAGNOSIS — S3992XS Unspecified injury of lower back, sequela: Secondary | ICD-10-CM

## 2020-12-12 DIAGNOSIS — Z23 Encounter for immunization: Secondary | ICD-10-CM | POA: Diagnosis not present

## 2021-01-23 ENCOUNTER — Other Ambulatory Visit: Payer: Self-pay | Admitting: Family Medicine

## 2021-01-23 DIAGNOSIS — J441 Chronic obstructive pulmonary disease with (acute) exacerbation: Secondary | ICD-10-CM

## 2021-01-23 NOTE — Telephone Encounter (Signed)
Requested Prescriptions  Pending Prescriptions Disp Refills   FLOVENT HFA 110 MCG/ACT inhaler [Pharmacy Med Name: [facility] HFA 110 MCG INHALER] 12 each 6    Sig: INHALE 2 PUFFS BY MOUTH INTO THE LUNGS DAILY     Pulmonology:  Corticosteroids Passed - 01/23/2021  1:29 AM      Passed - Valid encounter within last 12 months    Recent Outpatient Visits          3 months ago Tailbone injury, sequela   [facility] Tally Joe T, FNP   5 months ago ASCVD (arteriosclerotic cardiovascular disease)   [facility] Jerrol Banana., MD   11 months ago Acquired hypothyroidism   [facility] Jerrol Banana., MD   1 year ago COPD exacerbation [facility])   [facility] Jerrol Banana., MD   2 years ago COPD exacerbation [facility])   [facility] Jerrol Banana., MD      Future Appointments            In 3 weeks [place], Jake [place], [place], [place]

## 2021-02-17 ENCOUNTER — Encounter: Payer: Self-pay | Admitting: Family Medicine

## 2021-02-17 ENCOUNTER — Ambulatory Visit (INDEPENDENT_AMBULATORY_CARE_PROVIDER_SITE_OTHER): Payer: Medicare Other | Admitting: Family Medicine

## 2021-02-17 ENCOUNTER — Other Ambulatory Visit: Payer: Self-pay

## 2021-02-17 ENCOUNTER — Ambulatory Visit: Payer: Self-pay | Admitting: Family Medicine

## 2021-02-17 VITALS — BP 179/84 | HR 52 | Temp 98.4°F | Resp 16 | Ht 68.0 in | Wt 131.0 lb

## 2021-02-17 DIAGNOSIS — E7800 Pure hypercholesterolemia, unspecified: Secondary | ICD-10-CM

## 2021-02-17 DIAGNOSIS — F17218 Nicotine dependence, cigarettes, with other nicotine-induced disorders: Secondary | ICD-10-CM

## 2021-02-17 DIAGNOSIS — F17200 Nicotine dependence, unspecified, uncomplicated: Secondary | ICD-10-CM

## 2021-02-17 DIAGNOSIS — J449 Chronic obstructive pulmonary disease, unspecified: Secondary | ICD-10-CM

## 2021-02-17 DIAGNOSIS — R911 Solitary pulmonary nodule: Secondary | ICD-10-CM

## 2021-02-17 DIAGNOSIS — J441 Chronic obstructive pulmonary disease with (acute) exacerbation: Secondary | ICD-10-CM | POA: Diagnosis not present

## 2021-02-17 DIAGNOSIS — Z720 Tobacco use: Secondary | ICD-10-CM

## 2021-02-17 DIAGNOSIS — I10 Essential (primary) hypertension: Secondary | ICD-10-CM | POA: Diagnosis not present

## 2021-02-17 DIAGNOSIS — E039 Hypothyroidism, unspecified: Secondary | ICD-10-CM | POA: Diagnosis not present

## 2021-02-17 DIAGNOSIS — Z122 Encounter for screening for malignant neoplasm of respiratory organs: Secondary | ICD-10-CM | POA: Diagnosis not present

## 2021-02-17 NOTE — Assessment & Plan Note (Signed)
Doing well on flovent, no recent exacerbations. Continue working on cutting down tobacco. Due for nodule CT follow up, ordered.

## 2021-02-17 NOTE — Progress Notes (Signed)
° °  SUBJECTIVE:   CHIEF COMPLAINT / HPI:   Hypertension: - Medications: none - Compliance: n/a - Checking BP at home: daughter does Investment banker, corporate), says ok.  - Denies any SOB, CP, LE edema  HLD - medications: pravastatin - compliance: good - medication SEs: none  Emphysema/COPD - Medications: flovent - Compliance: good - Exacerbations in last 6 months: none - tobacco use: currently cutting back. 1 pack now lasts a week. At most 1ppd, ~60 years.  Hypothyroidism - Medications: Synthroid 140mcg - Current symptoms:   occasional nervousness - Denies change in energy level, diarrhea, and palpitations  Previous colonoscopy 08/2020 with adenomatoud polyps. Due for f/u given prior poor prep. Denies blood in stool.   Insomnia - trazodone nightly. Typically with trouble falling asleep. Feels refreshed in the morning.   OBJECTIVE:   BP (!) 179/84 (BP Location: Left Arm, Cuff Size: Normal)    Pulse (!) 52    Temp 98.4 F (36.9 C) (Temporal)    Resp 16    Ht 5\' 8"  (1.727 m)    Wt 131 lb (59.4 kg)    SpO2 95%    BMI 19.92 kg/m   Gen: well appearing, in NAD Card: RRR Lungs: CTAB Ext: WWP, no edema   ASSESSMENT/PLAN:   Primary hypertension Elevated today and on recheck. Reports home measurements reported by daughter within normal limits but unsure of numbers. Will have patient monitor at home, keep log, and f/u in 1 month with log. Continue working on tobacco cessation. Obtaining labs today.   COPD (chronic obstructive pulmonary disease) ([place]) Doing well on flovent, no recent exacerbations. Continue working on cutting down tobacco. Due for nodule CT follow up, ordered.   Acquired hypothyroidism Recheck labs and adjust as indicated.  Compulsive tobacco user syndrome Continue to work on cutting down.  HLD (hyperlipidemia) Tolerant of statin, continue. Recheck labs.     Myles Gip, DO

## 2021-02-17 NOTE — Assessment & Plan Note (Signed)
Tolerant of statin, continue. Recheck labs. 

## 2021-02-17 NOTE — Assessment & Plan Note (Addendum)
Recheck labs and adjust as indicated.  

## 2021-02-17 NOTE — Patient Instructions (Signed)
It was great to see you!  Our plans for today:  - Call GI for an appointment for repeat colonoscopy - [phone]. - Keep an eye on your blood pressure, keep a log of measurements and bring with you next month. - Someone should call you about the appointment for your CT scan.  We are checking some labs today, we will release these results to your MyChart.  Take care and seek immediate care sooner if you develop any concerns.   Dr. Ky Barban

## 2021-02-17 NOTE — Assessment & Plan Note (Signed)
Elevated today and on recheck. Reports home measurements reported by daughter within normal limits but unsure of numbers. Will have patient monitor at home, keep log, and f/u in 1 month with log. Continue working on tobacco cessation. Obtaining labs today.

## 2021-02-17 NOTE — Assessment & Plan Note (Signed)
Continue to work on cutting down.

## 2021-02-18 LAB — BASIC METABOLIC PANEL
BUN/Creatinine Ratio: 14 (ref 10–24)
BUN: 15 mg/dL (ref 8–27)
CO2: 25 mmol/L (ref 20–29)
Calcium: 9.8 mg/dL (ref 8.6–10.2)
Chloride: 103 mmol/L (ref 96–106)
Creatinine, Ser: 1.1 mg/dL (ref 0.76–1.27)
Glucose: 98 mg/dL (ref 70–99)
Potassium: 5.5 mmol/L — ABNORMAL HIGH (ref 3.5–5.2)
Sodium: 141 mmol/L (ref 134–144)
eGFR: 71 mL/min/{1.73_m2} (ref >59)

## 2021-02-18 LAB — LIPID PANEL
Chol/HDL Ratio: 3.7 ratio (ref 0.0–5.0)
Cholesterol, Total: 179 mg/dL (ref 100–199)
HDL: 49 mg/dL (ref >39)
LDL Chol Calc (NIH): 115 mg/dL — ABNORMAL HIGH (ref 0–99)
Triglycerides: 82 mg/dL (ref 0–149)
VLDL Cholesterol Cal: 15 mg/dL (ref 5–40)

## 2021-02-18 LAB — TSH: TSH: 2.98 u[IU]/mL (ref 0.450–4.500)

## 2021-03-04 ENCOUNTER — Ambulatory Visit
Admission: RE | Admit: 2021-03-04 | Discharge: 2021-03-04 | Disposition: A | Discharge status: Home / Self Care | Payer: Medicare Other | Source: Ambulatory Visit | Attending: Family Medicine | Admitting: Family Medicine

## 2021-03-04 ENCOUNTER — Other Ambulatory Visit: Payer: Self-pay

## 2021-03-04 DIAGNOSIS — F17218 Nicotine dependence, cigarettes, with other nicotine-induced disorders: Secondary | ICD-10-CM | POA: Diagnosis not present

## 2021-03-04 DIAGNOSIS — R911 Solitary pulmonary nodule: Secondary | ICD-10-CM | POA: Diagnosis not present

## 2021-03-04 DIAGNOSIS — I700 Atherosclerosis of aorta: Secondary | ICD-10-CM | POA: Diagnosis not present

## 2021-03-04 DIAGNOSIS — J441 Chronic obstructive pulmonary disease with (acute) exacerbation: Secondary | ICD-10-CM | POA: Diagnosis not present

## 2021-03-04 DIAGNOSIS — J439 Emphysema, unspecified: Secondary | ICD-10-CM | POA: Diagnosis not present

## 2021-03-04 DIAGNOSIS — F17200 Nicotine dependence, unspecified, uncomplicated: Secondary | ICD-10-CM | POA: Diagnosis not present

## 2021-03-04 DIAGNOSIS — Z122 Encounter for screening for malignant neoplasm of respiratory organs: Secondary | ICD-10-CM | POA: Diagnosis not present

## 2021-03-04 DIAGNOSIS — Z720 Tobacco use: Secondary | ICD-10-CM | POA: Diagnosis not present

## 2021-03-04 DIAGNOSIS — J9811 Atelectasis: Secondary | ICD-10-CM | POA: Diagnosis not present

## 2021-03-04 DIAGNOSIS — R918 Other nonspecific abnormal finding of lung field: Secondary | ICD-10-CM | POA: Diagnosis not present

## 2021-03-11 ENCOUNTER — Telehealth: Payer: Self-pay

## 2021-03-11 NOTE — Telephone Encounter (Signed)
Dr. Vicente Males, are you okay if patient gets rescheduled with Dr. Allen Norris?

## 2021-03-11 NOTE — Telephone Encounter (Signed)
Hope, would you be so kind to schedule this patient with Dr. Allen Norris per patient's request. Dr. Vicente Males was notified and he is fine with it.   Diagnosis: History of colonic polyps Z86.010 Colonoscopy due: now

## 2021-03-11 NOTE — Telephone Encounter (Signed)
Patient wife called and states he needed a repeat colonoscopy schedule. He wants to have colonoscopy with Dr. Allen Norris instead of Dr. Vicente Males. Please advised If this okay

## 2021-03-11 NOTE — Telephone Encounter (Signed)
Ok with me 

## 2021-03-12 ENCOUNTER — Other Ambulatory Visit: Payer: Self-pay

## 2021-03-12 DIAGNOSIS — Z86010 Personal history of colonic polyps: Secondary | ICD-10-CM

## 2021-03-12 MED ORDER — PEG 3350-KCL-NA BICARB-NACL 420 G PO SOLR: 4000.0000 mL | Freq: Once | ORAL | 0 refills | Status: AC

## 2021-03-12 NOTE — Progress Notes (Signed)
Gastroenterology Pre-Procedure Review  Request Date: 04/15/2021 Requesting Physician: Dr. Allen Norris  PATIENT REVIEW QUESTIONS: The patient responded to the following health history questions as indicated:    1. Are you having any GI issues? no 2. Do you have a personal history of Polyps? yes (last colonoscopy) 3. Do you have a family history of Colon Cancer or Polyps? no 4. Diabetes Mellitus? no 5. Joint replacements in the past 12 months?no 6. Major health problems in the past 3 months?no 7. Any artificial heart valves, MVP, or defibrillator?no    MEDICATIONS & ALLERGIES:    Patient reports the following regarding taking any anticoagulation/antiplatelet therapy:   Plavix, Coumadin, Eliquis, Xarelto, Lovenox, Pradaxa, Brilinta, or Effient? no Aspirin? no  Patient confirms/reports the following medications:  Current Outpatient Medications  Medication Sig Dispense Refill   FLOVENT HFA 110 MCG/ACT inhaler INHALE 2 PUFFS BY MOUTH INTO THE LUNGS DAILY 12 each 3   levothyroxine (SYNTHROID) 150 MCG tablet Take 1 tablet (150 mcg total) by mouth daily. 90 tablet 3   meloxicam (MOBIC) 15 MG tablet TAKE 1 TABLET (15 MG TOTAL) BY MOUTH DAILY. 30 tablet 0   pravastatin (PRAVACHOL) 40 MG tablet Take 1 tablet (40 mg total) by mouth daily. 90 tablet 3   traZODone (DESYREL) 100 MG tablet Take 1 tablet (100 mg total) by mouth at bedtime. 90 tablet 3   No current facility-administered medications for this visit.    Patient confirms/reports the following allergies:  No Known Allergies  No orders of the defined types were placed in this encounter.   AUTHORIZATION INFORMATION Primary Insurance: 1D#: Group #:  Secondary Insurance: 1D#: Group #:  SCHEDULE INFORMATION: Date: 04/15/2021 Time: Location:[facility]

## 2021-03-21 ENCOUNTER — Ambulatory Visit (INDEPENDENT_AMBULATORY_CARE_PROVIDER_SITE_OTHER): Payer: Medicare Other | Admitting: Physician Assistant

## 2021-03-21 ENCOUNTER — Other Ambulatory Visit: Payer: Self-pay

## 2021-03-21 ENCOUNTER — Encounter: Payer: Self-pay | Admitting: Physician Assistant

## 2021-03-21 VITALS — BP 110/64 | HR 55 | Temp 97.9°F | Ht 68.0 in | Wt 131.1 lb

## 2021-03-21 DIAGNOSIS — R030 Elevated blood-pressure reading, without diagnosis of hypertension: Secondary | ICD-10-CM | POA: Diagnosis not present

## 2021-03-21 NOTE — Progress Notes (Signed)
I,Sha'taria Tyson,acting as a Education administrator for [employer], PA-C.,have documented all relevant documentation on the behalf of Mikey Kirschner, PA-C,as directed by  Mikey Kirschner, PA-C while in the presence of Mikey Kirschner, PA-C.   Established patient visit   Patient: Hunter Oconnor   DOB: September 22, 1950   71 y.o. Male  MRN: [id number] Visit Date: 03/21/2021  Today's healthcare provider: Mikey Kirschner, PA-C   Cc. Htn f/u  Subjective    HPI  Hypertension, follow-up  BP Readings from Last 3 Encounters:  03/21/21 110/64  02/17/21 (!) 179/84  09/30/20 (!) 162/89   Wt Readings from Last 3 Encounters:  03/21/21 131 lb 1.6 oz (59.5 kg)  02/17/21 131 lb (59.4 kg)  09/30/20 129 lb 3.2 oz (58.6 kg)     He was last seen for hypertension 1 months ago.  BP at that visit was 179/84. Management since that visit includes monitor at home and and keep log and working on tobacco cessation.  He reports fair compliance with treatment. He is following a Low Sodium diet. He is not exercising. He does smoke.  Use of agents associated with hypertension: none.   Outside blood pressures are 110/70 (1/22) 100/62 (1/27) 118/62 (2/5) 110/64 (2/13) mainly taken in the afternoons. Daughter is a Emergency planning/management officer, it is the same cuff she uses on her patients.   Reports smoking less, one pack will last 2-3 days. He did smoke before coming in, smokes more in AM.   Symptoms: No chest pain No chest pressure  No palpitations No syncope  No dyspnea No orthopnea  No paroxysmal nocturnal dyspnea No lower extremity edema   Pertinent labs: Lab Results  Component Value Date   CHOL 179 02/17/2021   HDL 49 02/17/2021   LDLCALC 115 (H) 02/17/2021   TRIG 82 02/17/2021   CHOLHDL 3.7 02/17/2021   Lab Results  Component Value Date   NA 141 02/17/2021   K 5.5 (H) 02/17/2021   CREATININE 1.10 02/17/2021   EGFR 71 02/17/2021   GLUCOSE 98 02/17/2021   TSH 2.980 02/17/2021     The 10-year ASCVD risk score  (Arnett DK, et al., 2019) is: 18%   ---------------------------------------------------------------------------------------------------   Medications: Outpatient Medications Prior to Visit  Medication Sig   FLOVENT HFA 110 MCG/ACT inhaler INHALE 2 PUFFS BY MOUTH INTO THE LUNGS DAILY   levothyroxine (SYNTHROID) 150 MCG tablet Take 1 tablet (150 mcg total) by mouth daily.   meloxicam (MOBIC) 15 MG tablet TAKE 1 TABLET (15 MG TOTAL) BY MOUTH DAILY.   pravastatin (PRAVACHOL) 40 MG tablet Take 1 tablet (40 mg total) by mouth daily.   traZODone (DESYREL) 100 MG tablet Take 1 tablet (100 mg total) by mouth at bedtime.   No facility-administered medications prior to visit.    Review of Systems  Constitutional:  Negative for fatigue and fever.  Respiratory:  Negative for cough and shortness of breath.   Cardiovascular:  Negative for chest pain, palpitations and leg swelling.  Neurological:  Negative for dizziness and headaches.     Objective    Blood pressure 110/64, pulse (!) 55, temperature 97.9 F (36.6 C), temperature source Oral, height $RemoveBefo'5\' 8"'OPTYHkkJjac$  (1.727 m), weight 131 lb 1.6 oz (59.5 kg), SpO2 100 %.   Physical Exam Constitutional:      General: He is awake.     Appearance: He is well-developed.  HENT:     Head: Normocephalic.  Eyes:     Conjunctiva/sclera: Conjunctivae normal.  Cardiovascular:  Rate and Rhythm: Normal rate and regular rhythm.     Heart sounds: Normal heart sounds.  Pulmonary:     Effort: Pulmonary effort is normal.     Breath sounds: Normal breath sounds.  Skin:    General: Skin is warm.  Neurological:     Mental Status: He is alert and oriented to person, place, and time.  Psychiatric:        Attention and Perception: Attention normal.        Mood and Affect: Mood normal.        Speech: Speech normal.        Behavior: Behavior is cooperative.     No results found for any visits on 03/21/21.  Assessment & Plan     Problem List Items Addressed This  Visit       Cardiovascular and Mediastinum   White coat syndrome without diagnosis of hypertension - Primary    Elevated in office but several days of normal values at home, taken by daughter who is a home health nurse. I believe these to be accurate, pt often smokes before coming into office, elevated may be combination of this and white coat. Will continue to monitor pt advised to call or schedule appt if he starts getting elevated values at home       Return in about 4 months (around 07/19/2021) for chronic diseases.      I, Mikey Kirschner, PA-C have reviewed all documentation for this visit. The documentation on  03/21/2021 for the exam, diagnosis, procedures, and orders are all accurate and complete.    Mikey Kirschner, PA-C  [facility] [phone] (phone) [phone] (fax)  [place]

## 2021-03-21 NOTE — Assessment & Plan Note (Signed)
Elevated in office but several days of normal values at home, taken by daughter who is a home Programmer, applications. I believe these to be accurate, pt often smokes before coming into office, elevated may be combination of this and white coat. Will continue to monitor pt advised to call or schedule appt if he starts getting elevated values at home

## 2021-04-15 ENCOUNTER — Ambulatory Visit
Admission: RE | Admit: 2021-04-15 | Discharge: 2021-04-15 | Disposition: A | Discharge status: Home / Self Care | Payer: Medicare Other | Attending: Gastroenterology | Admitting: Gastroenterology

## 2021-04-15 ENCOUNTER — Encounter
Admission: RE | Disposition: A | Discharge status: Home / Self Care | Payer: Self-pay | Source: Home / Self Care | Attending: Gastroenterology

## 2021-04-15 ENCOUNTER — Ambulatory Visit: Payer: Medicare Other | Admitting: Anesthesiology

## 2021-04-15 ENCOUNTER — Encounter: Payer: Self-pay | Admitting: Gastroenterology

## 2021-04-15 DIAGNOSIS — F17210 Nicotine dependence, cigarettes, uncomplicated: Secondary | ICD-10-CM | POA: Insufficient documentation

## 2021-04-15 DIAGNOSIS — K219 Gastro-esophageal reflux disease without esophagitis: Secondary | ICD-10-CM | POA: Diagnosis not present

## 2021-04-15 DIAGNOSIS — Z1211 Encounter for screening for malignant neoplasm of colon: Secondary | ICD-10-CM | POA: Insufficient documentation

## 2021-04-15 DIAGNOSIS — J449 Chronic obstructive pulmonary disease, unspecified: Secondary | ICD-10-CM | POA: Insufficient documentation

## 2021-04-15 DIAGNOSIS — D126 Benign neoplasm of colon, unspecified: Secondary | ICD-10-CM | POA: Diagnosis not present

## 2021-04-15 DIAGNOSIS — Z860100 Personal history of colon polyps, unspecified: Secondary | ICD-10-CM

## 2021-04-15 DIAGNOSIS — K648 Other hemorrhoids: Secondary | ICD-10-CM | POA: Diagnosis not present

## 2021-04-15 DIAGNOSIS — K635 Polyp of colon: Secondary | ICD-10-CM | POA: Diagnosis not present

## 2021-04-15 DIAGNOSIS — E039 Hypothyroidism, unspecified: Secondary | ICD-10-CM | POA: Diagnosis not present

## 2021-04-15 HISTORY — PX: COLONOSCOPY WITH PROPOFOL: SHX5780

## 2021-04-15 SURGERY — COLONOSCOPY WITH PROPOFOL
Anesthesia: General

## 2021-04-15 MED ORDER — LIDOCAINE HCL (CARDIAC) PF 100 MG/5ML IV SOSY
PREFILLED_SYRINGE | INTRAVENOUS | Status: DC | PRN
  Administered 2021-04-15: 100 mg via INTRAVENOUS

## 2021-04-15 MED ORDER — SODIUM CHLORIDE FLUSH 0.9 % IV SOLN
INTRAVENOUS | Status: AC
  Filled 2021-04-15: qty 10

## 2021-04-15 MED ORDER — LIDOCAINE HCL (PF) 2 % IJ SOLN
INTRAMUSCULAR | Status: AC
  Filled 2021-04-15: qty 5

## 2021-04-15 MED ORDER — PROPOFOL 10 MG/ML IV BOLUS
INTRAVENOUS | Status: AC
  Filled 2021-04-15: qty 20

## 2021-04-15 MED ORDER — PROPOFOL 10 MG/ML IV BOLUS
INTRAVENOUS | Status: DC | PRN
  Administered 2021-04-15 (×2): 20 mg via INTRAVENOUS
  Administered 2021-04-15: 10 mg via INTRAVENOUS
  Administered 2021-04-15: 20 mg via INTRAVENOUS
  Administered 2021-04-15 (×2): 10 mg via INTRAVENOUS
  Administered 2021-04-15: 20 mg via INTRAVENOUS
  Administered 2021-04-15: 100 mg via INTRAVENOUS
  Administered 2021-04-15 (×2): 20 mg via INTRAVENOUS

## 2021-04-15 MED ORDER — SODIUM CHLORIDE 0.9 % IV SOLN: INTRAVENOUS | Status: DC

## 2021-04-15 NOTE — Anesthesia Postprocedure Evaluation (Signed)
Anesthesia Post Note ? ?Patient: Hunter Oconnor ? ?Procedure(s) Performed: COLONOSCOPY WITH PROPOFOL ? ?Patient location during evaluation: PACU ?Anesthesia Type: General ?Level of consciousness: awake and alert, oriented and patient cooperative ?Pain management: pain level controlled ?Vital Signs Assessment: post-procedure vital signs reviewed and stable ?Respiratory status: spontaneous breathing, nonlabored ventilation and respiratory function stable ?Cardiovascular status: blood pressure returned to baseline and stable ?Postop Assessment: adequate PO intake ?Anesthetic complications: no ? ? ?No notable events documented. ? ? ?Last Vitals:  ?Vitals:  ? 04/15/21 1107 04/15/21 1117  ?BP: (!) 109/95 116/78  ?Pulse: 64 (!) 48  ?Resp: 19 14  ?Temp:    ?SpO2: 100% 100%  ?  ?Last Pain:  ?Vitals:  ? 04/15/21 1107  ?TempSrc:   ?PainSc: 0-No pain  ? ? ?  ?  ?  ?  ?  ?  ? ?Darrin Nipper ? ? ? ? ?

## 2021-04-15 NOTE — H&P (Signed)
? ?Lucilla Lame, MD [facility] ?[address] ?[place], [place] [address] ?Phone:[phone] ?Fax : [phone] ? ?Primary Care Physician:  Jerrol Banana., MD ?Primary Gastroenterologist:  Dr. Allen Norris ? ?Pre-Procedure History & Physical: ?HPI:  Hunter Oconnor is a 71 y.o. male is here for an colonoscopy. ?  ?Past Medical History:  ?Diagnosis Date  ? CAFL (chronic airflow limitation) ([place]) 07/10/2014  ? COPD (chronic obstructive pulmonary disease) ([place])   ? Hernia of abdominal wall   ? Hyperlipidemia   ? Hypothyroid   ? Insomnia   ? ? ?Past Surgical History:  ?Procedure Laterality Date  ? APPENDECTOMY    ? BACK SURGERY    ? neck    ? [place] SURGERY  2008  ? Ruptured disc  ? COLONOSCOPY N/A 08/27/2020  ? Procedure: COLONOSCOPY;  Surgeon: Jonathon Bellows, MD;  Location: [facility] ENDOSCOPY;  Service: Gastroenterology;  Laterality: N/A;  ? COLONOSCOPY WITH PROPOFOL N/A 12/11/2019  ? Procedure: COLONOSCOPY WITH PROPOFOL;  Surgeon: Jonathon Bellows, MD;  Location: [facility] ENDOSCOPY;  Service: Gastroenterology;  Laterality: N/A;  ? HERNIA REPAIR    ? INGUINAL HERNIA REPAIR Right   ? SHOULDER SURGERY    ? XI ROBOTIC ASSISTED INGUINAL HERNIA REPAIR WITH MESH Left 11/10/2019  ? Procedure: XI ROBOTIC ASSISTED INGUINAL HERNIA REPAIR WITH MESH;  Surgeon: Ronny Bacon, MD;  Location: [facility] ORS;  Service: General;  Laterality: Left;  ? ? ?Prior to Admission medications   ?Medication Sig Start Date End Date Taking? Authorizing Provider  ?levothyroxine (SYNTHROID) 150 MCG tablet Take 1 tablet (150 mcg total) by mouth daily. 09/30/20  Yes Gwyneth Sprout, FNP  ?FLOVENT HFA 110 MCG/ACT inhaler INHALE 2 PUFFS BY MOUTH INTO THE LUNGS DAILY 01/23/21   Jerrol Banana., MD  ?meloxicam (MOBIC) 15 MG tablet TAKE 1 TABLET (15 MG TOTAL) BY MOUTH DAILY. 10/28/20   Jerrol Banana., MD  ?pravastatin (PRAVACHOL) 40 MG tablet Take 1 tablet (40 mg total) by mouth daily. 09/30/20   Gwyneth Sprout, FNP  ?traZODone (DESYREL) 100 MG tablet Take 1  tablet (100 mg total) by mouth at bedtime. 09/30/20   Gwyneth Sprout, FNP  ? ? ?Allergies as of 03/12/2021  ? (No Known Allergies)  ? ? ?Family History  ?Problem Relation Age of Onset  ? Cancer Mother   ?     colon ca  ? Hypertension Father   ? Cancer Father   ? ? ?Social History  ? ?Socioeconomic History  ? Marital status: Married  ?  Spouse name: Jodeci Roarty  ? Number of children: 1  ? Years of education: 25  ? Highest education level: Associate degree: occupational, Hotel manager, or vocational program  ?Occupational History  ?  Comment: part time  ? Occupation: Part Time Employee  ?  Employer: [employer]  ?Tobacco Use  ? Smoking status: Every Day  ?  Packs/day: 1.00  ?  Years: 53.00  ?  Pack years: 53.00  ?  Types: Cigarettes  ? Smokeless tobacco: Never  ?Vaping Use  ? Vaping Use: Never used  ?Substance and Sexual Activity  ? Alcohol use: Yes  ?  Comment: 1-2 drinks on the weekends  ? Drug use: No  ? Sexual activity: Yes  ?Other Topics Concern  ? Not on file  ?Social History Narrative  ? Not on file  ? ?Social Determinants of Health  ? ?Financial Resource Strain: Low Risk   ? Difficulty of Paying Living Expenses: Not hard  at all  ?Food Insecurity: No Food Insecurity  ? Worried About Charity fundraiser in the Last Year: Never true  ? Ran Out of Food in the Last Year: Never true  ?Transportation Needs: Not on file  ?Physical Activity: Sufficiently Active  ? Days of Exercise per Week: 7 days  ? Minutes of Exercise per Session: 30 min  ?Stress: No Stress Concern Present  ? Feeling of Stress : Not at all  ?Social Connections: Moderately Isolated  ? Frequency of Communication with Friends and Family: Never  ? Frequency of Social Gatherings with Friends and Family: More than three times a week  ? Attends Religious Services: Never  ? Active Member of Clubs or Organizations: No  ? Attends Archivist Meetings: Never  ? Marital Status: Married  ?Intimate Partner Violence: Not At Risk  ? Fear of Current or  Ex-Partner: No  ? Emotionally Abused: No  ? Physically Abused: No  ? Sexually Abused: No  ? ? ?Review of Systems: ?See HPI, otherwise negative ROS ? ?Physical Exam: ?BP (!) 157/89   Pulse (!) 55   Temp (!) 97.3 ?F (36.3 ?C) (Temporal)   Resp 17   Ht '5\' 8"'$  (1.727 m)   Wt 61.2 kg   SpO2 98%   BMI 20.53 kg/m?  ?General:   Alert,  pleasant and cooperative in NAD ?Head:  Normocephalic and atraumatic. ?Neck:  Supple; no masses or thyromegaly. ?Lungs:  Clear throughout to auscultation.    ?Heart:  Regular rate and rhythm. ?Abdomen:  Soft, nontender and nondistended. Normal bowel sounds, without guarding, and without rebound.   ?Neurologic:  Alert and  oriented x4;  grossly normal neurologically. ? ?Impression/Plan: ?Hunter Oconnor is here for an colonoscopy to be performed for a history of adenomatous polyps on 2022 ? ?Risks, benefits, limitations, and alternatives regarding  colonoscopy have been reviewed with the patient.  Questions have been answered.  All parties agreeable. ? ? ?Lucilla Lame, MD  04/15/2021, 9:34 AM ?

## 2021-04-15 NOTE — Anesthesia Preprocedure Evaluation (Addendum)
Anesthesia Evaluation  ?Patient identified by MRN, date of birth, ID band ?Patient awake ? ? ? ?Reviewed: ?Allergy & Precautions, NPO status , Patient's Chart, lab work & pertinent test results ? ?History of Anesthesia Complications ?Negative for: history of anesthetic complications ? ?Airway ?Mallampati: IV ? ? ?Neck ROM: Full ? ? ? Dental ? ?(+) Upper Dentures ?  ?Pulmonary ?COPD, Current Smoker (less than 1 ppd) and Patient abstained from smoking.,  ?  ?Pulmonary exam normal ?breath sounds clear to auscultation ? ? ? ? ? ? Cardiovascular ?Exercise Tolerance: Good ?negative cardio ROS ?Normal cardiovascular exam ?Rhythm:Regular Rate:Normal ? ? ?  ?Neuro/Psych ?negative neurological ROS ?   ? GI/Hepatic ?GERD  ,  ?Endo/Other  ?Hypothyroidism  ? Renal/GU ?negative Renal ROS  ? ?  ?Musculoskeletal ? ? Abdominal ?  ?Peds ? Hematology ?negative hematology ROS ?(+)   ?Anesthesia Other Findings ? ? Reproductive/Obstetrics ? ?  ? ? ? ? ? ? ? ? ? ? ? ? ? ?  ?  ? ? ? ? ? ? ? ?Anesthesia Physical ?Anesthesia Plan ? ?ASA: 2 ? ?Anesthesia Plan: General  ? ?Post-op Pain Management:   ? ?Induction: Intravenous ? ?PONV Risk Score and Plan: 1 and Propofol infusion, TIVA and Treatment may vary due to age or medical condition ? ?Airway Management Planned: Natural Airway ? ?Additional Equipment:  ? ?Intra-op Plan:  ? ?Post-operative Plan:  ? ?Informed Consent: I have reviewed the patients History and Physical, chart, labs and discussed the procedure including the risks, benefits and alternatives for the proposed anesthesia with the patient or authorized representative who has indicated his/her understanding and acceptance.  ? ? ? ? ? ?Plan Discussed with: CRNA ? ?Anesthesia Plan Comments: (LMA/GETA backup discussed.  Patient consented for risks of anesthesia including but not limited to:  ?- adverse reactions to medications ?- damage to eyes, teeth, lips or other oral mucosa ?- nerve damage due to  positioning  ?- sore throat or hoarseness ?- damage to heart, brain, nerves, lungs, other parts of body or loss of life ? ?Informed patient about role of CRNA in peri- and intra-operative care.  Patient voiced understanding.)  ? ? ? ? ? ? ?Anesthesia Quick Evaluation ? ?

## 2021-04-15 NOTE — Op Note (Signed)
[facility] ?Gastroenterology ?Patient Name: Hunter Oconnor ?Procedure Date: 04/15/2021 10:25 AM ?MRN: [id number] ?Account #: [id number] ?Date of Birth: 14-Jan-1951 ?Admit Type: Outpatient ?Age: 71 ?Room: [facility] ENDO ROOM 4 ?Gender: Male ?Note Status: Finalized ?Instrument Name: Colonoscope [id number] ?Procedure:             Colonoscopy ?Indications:           High risk colon cancer surveillance: Personal history  ?                       of colonic polyps ?Providers:             Lucilla Lame MD, MD ?Referring MD:          Janine Ores. Rosanna Randy, MD (Referring MD) ?Medicines:             Propofol per Anesthesia ?Complications:         No immediate complications. ?Procedure:             Pre-Anesthesia Assessment: ?                       - Prior to the procedure, a History and Physical was  ?                       performed, and patient medications and allergies were  ?                       reviewed. The patient's tolerance of previous  ?                       anesthesia was also reviewed. The risks and benefits  ?                       of the procedure and the sedation options and risks  ?                       were discussed with the patient. All questions were  ?                       answered, and informed consent was obtained. Prior  ?                       Anticoagulants: The patient has taken no previous  ?                       anticoagulant or antiplatelet agents. ASA Grade  ?                       Assessment: II - A patient with mild systemic disease.  ?                       After reviewing the risks and benefits, the patient  ?                       was deemed in satisfactory condition to undergo the  ?                       procedure. ?  After obtaining informed consent, the colonoscope was  ?                       passed under direct vision. Throughout the procedure,  ?                       the patient's blood pressure, pulse, and oxygen  ?                       saturations  were monitored continuously. The  ?                       Colonoscope was introduced through the anus and  ?                       advanced to the the cecum, identified by appendiceal  ?                       orifice and ileocecal valve. The colonoscopy was  ?                       performed without difficulty. The patient tolerated  ?                       the procedure well. The quality of the bowel  ?                       preparation was good. ?Findings: ?     A 4 mm polyp was found in the cecum. The polyp was sessile. The polyp  ?     was removed with a cold biopsy forceps. Resection and retrieval were  ?     complete. ?     A 4 mm polyp was found in the descending colon. The polyp was sessile.  ?     The polyp was removed with a cold biopsy forceps. Resection and  ?     retrieval were complete. ?     Two sessile polyps were found in the sigmoid colon. The polyps were 5 to  ?     8 mm in size. These polyps were removed with a cold snare. Resection and  ?     retrieval were complete. ?     Non-bleeding internal hemorrhoids were found during retroflexion. The  ?     hemorrhoids were Grade II (internal hemorrhoids that prolapse but reduce  ?     spontaneously). ?Impression:            - One 4 mm polyp in the cecum, removed with a cold  ?                       biopsy forceps. Resected and retrieved. ?                       - One 4 mm polyp in the descending colon, removed with  ?                       a cold biopsy forceps. Resected and retrieved. ?                       - Two 5  to 8 mm polyps in the sigmoid colon, removed  ?                       with a cold snare. Resected and retrieved. ?                       - Non-bleeding internal hemorrhoids. ?Recommendation:        - Discharge patient to home. ?                       - Resume previous diet. ?                       - Continue present medications. ?                       - Await pathology results. ?                       - Repeat colonoscopy in 5 years for  surveillance. ?Procedure Code(s):     --- Professional --- ?                       [phone], Colonoscopy, flexible; with removal of  ?                       tumor(s), polyp(s), or other lesion(s) by snare  ?                       technique ?                       45380, 59, Colonoscopy, flexible; with biopsy, single  ?                       or multiple ?Diagnosis Code(s):     --- Professional --- ?                       Z86.010, Personal history of colonic polyps ?                       K63.5, Polyp of colon ?CPT copyright 2019 American Medical Association. All rights reserved. ?The codes documented in this report are preliminary and upon coder review may  ?be revised to meet current compliance requirements. ?Lucilla Lame MD, MD ?04/15/2021 11:00:16 AM ?This report has been signed electronically. ?Number of Addenda: 0 ?Note Initiated On: 04/15/2021 10:25 AM ?Scope Withdrawal Time: 0 hours 15 minutes 40 seconds  ?Total Procedure Duration: 0 hours 21 minutes 12 seconds  ?Estimated Blood Loss:  Estimated blood loss: none. ?     [facility] ?

## 2021-04-15 NOTE — Interval H&P Note (Deleted)
? ?Hunter Lame, MD [facility] ?[address] ?[place], [place] [address] ?[phone] ?Fax : [phone] ? ?Primary Care Physician:  Hunter Oconnor., MD ?Primary Gastroenterologist:  Hunter Oconnor ? ?Pre-Procedure History & Physical: ?HPI:  Hunter Oconnor is a 71 y.o. male is here for an ERCP. ?  ?Past Medical History:  ?Diagnosis Date  ? CAFL (chronic airflow limitation) ([place]) 07/10/2014  ? COPD (chronic obstructive pulmonary disease) ([place])   ? Hernia of abdominal wall   ? Hyperlipidemia   ? Hypothyroid   ? Insomnia   ? ? ?Past Surgical History:  ?Procedure Laterality Date  ? APPENDECTOMY    ? BACK SURGERY    ? neck    ? [place] SURGERY  2008  ? Ruptured disc  ? COLONOSCOPY N/A 08/27/2020  ? Procedure: COLONOSCOPY;  Surgeon: Hunter Bellows, MD;  Location: [facility] ENDOSCOPY;  Service: Gastroenterology;  Laterality: N/A;  ? COLONOSCOPY WITH PROPOFOL N/A 12/11/2019  ? Procedure: COLONOSCOPY WITH PROPOFOL;  Surgeon: Hunter Bellows, MD;  Location: [facility] ENDOSCOPY;  Service: Gastroenterology;  Laterality: N/A;  ? HERNIA REPAIR    ? INGUINAL HERNIA REPAIR Right   ? SHOULDER SURGERY    ? XI ROBOTIC ASSISTED INGUINAL HERNIA REPAIR WITH MESH Left 11/10/2019  ? Procedure: XI ROBOTIC ASSISTED INGUINAL HERNIA REPAIR WITH MESH;  Surgeon: Hunter Bacon, MD;  Location: [facility] ORS;  Service: General;  Laterality: Left;  ? ? ?Prior to Admission medications   ?Medication Sig Start Date End Date Taking? Authorizing Provider  ?levothyroxine (SYNTHROID) 150 MCG tablet Take 1 tablet (150 mcg total) by mouth daily. 09/30/20  Yes Hunter Sprout, FNP  ?FLOVENT HFA 110 MCG/ACT inhaler INHALE 2 PUFFS BY MOUTH INTO THE LUNGS DAILY 01/23/21   Hunter Oconnor., MD  ?meloxicam (MOBIC) 15 MG tablet TAKE 1 TABLET (15 MG TOTAL) BY MOUTH DAILY. 10/28/20   Hunter Oconnor., MD  ?pravastatin (PRAVACHOL) 40 MG tablet Take 1 tablet (40 mg total) by mouth daily. 09/30/20   Hunter Sprout, FNP  ?traZODone (DESYREL) 100 MG tablet Take 1 tablet  (100 mg total) by mouth at bedtime. 09/30/20   Hunter Sprout, FNP  ? ? ?Allergies as of 03/12/2021  ? (No Known Allergies)  ? ? ?Family History  ?Problem Relation Age of Onset  ? Cancer Mother   ?     colon ca  ? Hypertension Father   ? Cancer Father   ? ? ?Social History  ? ?Socioeconomic History  ? Marital status: Married  ?  Spouse name: Hunter Oconnor  ? Number of children: 1  ? Years of education: 72  ? Highest education level: Associate degree: occupational, Hotel manager, or vocational program  ?Occupational History  ?  Comment: part time  ? Occupation: Part Time Employee  ?  Employer: [employer]  ?Tobacco Use  ? Smoking status: Every Day  ?  Packs/day: 1.00  ?  Years: 53.00  ?  Pack years: 53.00  ?  Types: Cigarettes  ? Smokeless tobacco: Never  ?Vaping Use  ? Vaping Use: Never used  ?Substance and Sexual Activity  ? Alcohol use: Yes  ?  Comment: 1-2 drinks on the weekends  ? Drug use: No  ? Sexual activity: Yes  ?Other Topics Concern  ? Not on file  ?Social History Narrative  ? Not on file  ? ?Social Determinants of Health  ? ?Financial Resource Strain: Low Risk   ? Difficulty of Paying Living Expenses: Not hard  at all  ?Food Insecurity: No Food Insecurity  ? Worried About Charity fundraiser in the Last Year: Never true  ? Ran Out of Food in the Last Year: Never true  ?Transportation Needs: Not on file  ?Physical Activity: Sufficiently Active  ? Days of Exercise per Week: 7 days  ? Minutes of Exercise per Session: 30 min  ?Stress: No Stress Concern Present  ? Feeling of Stress : Not at all  ?Social Connections: Moderately Isolated  ? Frequency of Communication with Friends and Family: Never  ? Frequency of Social Gatherings with Friends and Family: More than three times a week  ? Attends Religious Services: Never  ? Active Member of Clubs or Organizations: No  ? Attends Archivist Meetings: Never  ? Marital Status: Married  ?Intimate Partner Violence: Not At Risk  ? Fear of Current or Ex-Partner: No  ?  Emotionally Abused: No  ? Physically Abused: No  ? Sexually Abused: No  ? ? ?Review of Systems: ?See HPI, otherwise negative ROS ? ?Physical Exam: ?BP (!) 109/95   Pulse 64   Temp (!) 97.3 ?F (36.3 ?C) (Temporal)   Resp 19   Ht '5\' 8"'$  (1.727 m)   Wt 61.2 kg   SpO2 100%   BMI 20.53 kg/m?  ?General:   Alert,  pleasant and cooperative in NAD ?Head:  Normocephalic and atraumatic. ?Neck:  Supple; no masses or thyromegaly. ?Lungs:  Clear throughout to auscultation.    ?Heart:  Regular rate and rhythm. ?Abdomen:  Soft, nontender and nondistended. Normal bowel sounds, without guarding, and without rebound.   ?Neurologic:  Alert and  oriented x4;  grossly normal neurologically. ? ?Impression/Plan: ?Hunter Oconnor is here for an ERCP to be performed for CBD stone ? ?Risks, benefits, limitations, and alternatives regarding  ERCP have been reviewed with the patient.  Questions have been answered.  All parties agreeable. ? ? ?Hunter Lame, MD  04/15/2021, 11:11 AM ?

## 2021-04-15 NOTE — Transfer of Care (Signed)
Immediate Anesthesia Transfer of Care Note ? ?Patient: Hunter Oconnor ? ?Procedure(s) Performed: COLONOSCOPY WITH PROPOFOL ? ?Patient Location: PACU ? ?Anesthesia Type:General ? ?Level of Consciousness: awake ? ?Airway & Oxygen Therapy: Patient Spontanous Breathing ? ?Post-op Assessment: Report given to RN and Post -op Vital signs reviewed and stable ? ?Post vital signs: Reviewed and stable ? ?Last Vitals:  ?Vitals Value Taken Time  ?BP 116/62 04/15/21 1057  ?Temp    ?Pulse 56 04/15/21 1057  ?Resp 14 04/15/21 1057  ?SpO2 99 % 04/15/21 1057  ? ? ?Last Pain:  ?Vitals:  ? 04/15/21 1057  ?TempSrc: Temporal  ?PainSc: Asleep  ?   ? ?  ? ?Complications: No notable events documented. ?

## 2021-04-16 ENCOUNTER — Encounter: Payer: Self-pay | Admitting: Gastroenterology

## 2021-04-16 LAB — SURGICAL PATHOLOGY

## 2021-04-21 ENCOUNTER — Encounter: Payer: Self-pay | Admitting: Gastroenterology

## 2021-05-06 ENCOUNTER — Telehealth: Payer: Self-pay | Admitting: *Deleted

## 2021-05-06 NOTE — Chronic Care Management (AMB) (Signed)
?  Care Management  ? ?Note ? ?05/06/2021 ?Name: Hunter Oconnor MRN: [id number] DOB: 1950-03-16 ? ?Hunter Oconnor is a 71 y.o. year old male who is a primary care patient of Jerrol Banana., MD. I reached out to [name] by phone today offer care coordination services.  ? ?[name] was given information about care management services today including:  ?Care management services include personalized support from designated clinical staff supervised by his physician, including individualized plan of care and coordination with other care providers ?24/7 contact phone numbers for assistance for urgent and routine care needs. ?The patient may stop care management services at any time by phone call to the office staff. ? ?Patient agreed to services and verbal consent obtained.  ? ?Follow up plan: ?Telephone appointment with care management team member scheduled for: 05/12/2021 ? ?Zhuri Krass, CCMA ?Care Guide, Embedded Care Coordination ?[place]  Care Management  ?Direct Dial: [phone] ? ? ?

## 2021-05-12 ENCOUNTER — Ambulatory Visit: Payer: Medicare Other

## 2021-05-12 DIAGNOSIS — R053 Chronic cough: Secondary | ICD-10-CM

## 2021-05-12 DIAGNOSIS — J441 Chronic obstructive pulmonary disease with (acute) exacerbation: Secondary | ICD-10-CM

## 2021-05-12 NOTE — Chronic Care Management (AMB) (Signed)
   Care Management    RN Visit Note  05/12/2021 Name: Hunter Oconnor MRN: [id number] DOB: 05/01/1950  Subjective: Hunter Oconnor is a 71 y.o. year old male who is a primary care patient of Jerrol Banana., MD. The care management team was consulted for assistance with disease management and care coordination needs.    Engaged with patient by telephone for initial visit in response to provider referral for case management and care coordination services.   Consent to Services:   Hunter Oconnor was given information about Care Management services including:  Care Management services includes personalized support from designated clinical staff supervised by his physician, including individualized plan of care and coordination with other care providers 24/7 contact phone numbers for assistance for urgent and routine care needs. The patient may stop case management services at any time by phone call to the office staff.  Patient agreed to services and consent obtained.   Assessment: Review of patient past medical history, allergies, medications, health status, including review of consultants reports, laboratory and other test data, was performed as part of comprehensive evaluation and provision of chronic care management services.   SDOH (Social Determinants of Health) assessments and interventions performed:   SDOH Interventions    Flowsheet Row Most Recent Value  SDOH Interventions   Food Insecurity Interventions Intervention Not Indicated  Transportation Interventions Intervention Not Indicated            Care Plan  No Known Allergies  Outpatient Encounter Medications as of 05/12/2021  Medication Sig   FLOVENT HFA 110 MCG/ACT inhaler INHALE 2 PUFFS BY MOUTH INTO THE LUNGS DAILY   levothyroxine (SYNTHROID) 150 MCG tablet Take 1 tablet (150 mcg total) by mouth daily.   pravastatin (PRAVACHOL) 40 MG tablet Take 1 tablet (40 mg total) by mouth daily.   traZODone (DESYREL) 100 MG  tablet Take 1 tablet (100 mg total) by mouth at bedtime.   meloxicam (MOBIC) 15 MG tablet TAKE 1 TABLET (15 MG TOTAL) BY MOUTH DAILY.   No facility-administered encounter medications on file as of 05/12/2021.    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 Reports compliance with medications and plan for COPD management. Reports a cough relieved with over the counter cough medicine. Denies current complaints of shortness of breath. Denies current concerns regarding medication management or prescription cost. Denies current Reports currently doing well in the home. Denies current care management needs.   PLAN Hunter Oconnor will contact the clinic if he requires additional outreach. The care management team will gladly assist.   [facility] Care Management [facility] [phone]

## 2021-07-18 NOTE — Progress Notes (Deleted)
      Established patient visit   Patient: Hunter Oconnor   DOB: 02/04/1950   71 y.o. Male  MRN: [id number] Visit Date: 07/21/2021  Today's healthcare provider: Wilhemena Durie, MD   No chief complaint on file.  Subjective    HPI  Hypertension, follow-up  BP Readings from Last 3 Encounters:  04/15/21 116/78  03/21/21 110/64  02/17/21 (!) 179/84   Wt Readings from Last 3 Encounters:  04/15/21 135 lb (61.2 kg)  03/21/21 131 lb 1.6 oz (59.5 kg)  02/17/21 131 lb (59.4 kg)     He was last seen for hypertension 4 months ago.  BP at that visit was as above. Management since that visit includes none.  He reports {excellent/good/fair/poor:19665} compliance with treatment. He {is/is not:9024} having side effects. {document side effects if present:1} He is following a {diet:21022986} diet. He {is/is not:9024} exercising. He {does/does not:200015} smoke.  Use of agents associated with hypertension: {bp agents assoc with hypertension:511::"none"}.   Outside blood pressures are {***enter patient reported home BP readings, or 'not being checked':1}. Symptoms: {Yes/No:20286} chest pain {Yes/No:20286} chest pressure  {Yes/No:20286} palpitations {Yes/No:20286} syncope  {Yes/No:20286} dyspnea {Yes/No:20286} orthopnea  {Yes/No:20286} paroxysmal nocturnal dyspnea {Yes/No:20286} lower extremity edema   Pertinent labs Lab Results  Component Value Date   CHOL 179 02/17/2021   HDL 49 02/17/2021   LDLCALC 115 (H) 02/17/2021   TRIG 82 02/17/2021   CHOLHDL 3.7 02/17/2021   Lab Results  Component Value Date   NA 141 02/17/2021   K 5.5 (H) 02/17/2021   CREATININE 1.10 02/17/2021   EGFR 71 02/17/2021   GLUCOSE 98 02/17/2021   TSH 2.980 02/17/2021     The 10-year ASCVD risk score (Arnett DK, et al., 2019) is: 19.6%  ---------------------------------------------------------------------------------------------------   Medications: Outpatient Medications Prior to Visit   Medication Sig   FLOVENT HFA 110 MCG/ACT inhaler INHALE 2 PUFFS BY MOUTH INTO THE LUNGS DAILY   levothyroxine (SYNTHROID) 150 MCG tablet Take 1 tablet (150 mcg total) by mouth daily.   meloxicam (MOBIC) 15 MG tablet TAKE 1 TABLET (15 MG TOTAL) BY MOUTH DAILY.   pravastatin (PRAVACHOL) 40 MG tablet Take 1 tablet (40 mg total) by mouth daily.   traZODone (DESYREL) 100 MG tablet Take 1 tablet (100 mg total) by mouth at bedtime.   No facility-administered medications prior to visit.    Review of Systems  {Labs  Heme  Chem  Endocrine  Serology  Results Review (optional):23779}   Objective    There were no vitals taken for this visit. {Show previous vital signs (optional):23777}  Physical Exam  ***  No results found for any visits on 07/21/21.  Assessment & Plan     ***  No follow-ups on file.      {provider attestation***:1}   Wilhemena Durie, MD  [facility] [phone] (phone) [phone] (fax)  [place]

## 2021-07-21 ENCOUNTER — Ambulatory Visit: Payer: Medicare Other | Admitting: Family Medicine

## 2021-08-05 ENCOUNTER — Other Ambulatory Visit: Payer: Self-pay | Admitting: Family Medicine

## 2021-08-05 DIAGNOSIS — G4700 Insomnia, unspecified: Secondary | ICD-10-CM

## 2021-08-05 DIAGNOSIS — E039 Hypothyroidism, unspecified: Secondary | ICD-10-CM

## 2021-08-06 NOTE — Telephone Encounter (Signed)
Requested Prescriptions  Pending Prescriptions Disp Refills  . levothyroxine (SYNTHROID) 150 MCG tablet [Pharmacy Med Name: LEVOTHYROXIN TAB 150MCG] 90 tablet 1    Sig: TAKE 1 TABLET DAILY     Endocrinology:  Hypothyroid Agents Passed - 08/05/2021 10:32 AM      Passed - TSH in normal range and within 360 days    TSH  Date Value Ref Range Status  02/17/2021 2.980 0.450 - 4.500 uIU/mL Final         Passed - Valid encounter within last 12 months    Recent Outpatient Visits          4 months ago White coat syndrome without diagnosis of hypertension   [employer], [place], PA-C   5 months ago Primary hypertension   [facility] Myles Gip, DO   10 months ago Tailbone injury, sequela   [facility] Tally Joe T, FNP   12 months ago ASCVD (arteriosclerotic cardiovascular disease)   [facility] Jerrol Banana., MD   1 year ago Acquired hypothyroidism   [facility] Jerrol Banana., MD      Future Appointments            In 2 months Jerrol Banana., MD [facility], [facility]           . traZODone (DESYREL) 100 MG tablet [Pharmacy Med Name: TRAZODONE TAB '100MG'$ ] 90 tablet 1    Sig: TAKE 1 TABLET AT BEDTIME     Psychiatry: Antidepressants - Serotonin Modulator Passed - 08/05/2021 10:32 AM      Passed - Valid encounter within last 6 months    Recent Outpatient Visits          4 months ago White coat syndrome without diagnosis of hypertension   [facility] Thedore Mins, [place], PA-C   5 months ago Primary hypertension   [facility] Myles Gip, DO   10 months ago Tailbone injury, sequela   [facility] Tally Joe T, FNP   12 months ago ASCVD (arteriosclerotic cardiovascular disease)   [facility] Jerrol Banana., MD   1 year ago Acquired hypothyroidism   [facility] Jerrol Banana., MD      Future Appointments            In 2 months Jerrol Banana., MD [facility], [place]

## 2021-09-19 ENCOUNTER — Ambulatory Visit: Payer: Self-pay

## 2021-09-19 NOTE — Chronic Care Management (AMB) (Signed)
   09/19/2021  Hunter Oconnor Oct 04, 1950 [id number]   Documentation encounter created to complete case transition. The care management team will continue to follow for care coordination as needed.   [facility] [phone]

## 2021-09-29 IMAGING — CT NM PET TUM IMG INITIAL (PI) SKULL BASE T - THIGH
1 of 9 series · 1 of 25 positions shown · non-contrast
Comparison: Chest CTs 03/06/2020 and 06/03/2020

CLINICAL DATA: Initial treatment strategy for pulmonary nodule.

EXAM:
NUCLEAR MEDICINE PET SKULL BASE TO THIGH
TECHNIQUE: 7.94 mCi F-18 FDG was injected intravenously. Full-ring PET imaging
was performed from the skull base to thigh after the radiotracer. CT
data was obtained and used for attenuation correction and anatomic
localization.
Fasting blood glucose: 92 mg/dl

[Series 3: ct wb 5.0 b30f · axial · 5.0mm · 0.98mm/px · 1 of 290 slices shown]
[im 290/290  brain]
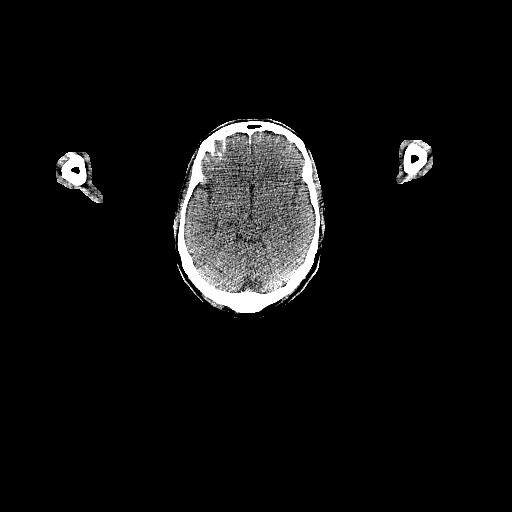

[1 of 25 positions shown; findings below may reference images not displayed]

FINDINGS: Mediastinal blood pool activity: SUV max

Liver activity: SUV max NA

NECK: No hypermetabolic lymph nodes in the neck.

Incidental CT findings: Bilateral carotid artery calcifications are
noted. There is also extensive mucoperiosteal thickening involving
the right maxillary sinus.

CHEST: Stable appearing 11 mm irregular nodular lesion in the left
lower lobe. This does not demonstrate any hypermetabolism with SUV
max of 0.88, well below mediastinal background.

No other pulmonary lesions are identified. No enlarged or
hypermetabolic mediastinal or hilar lymph nodes. No supraclavicular
or axillary adenopathy.

Incidental CT findings: Stable aortic and coronary artery
calcifications. Stable emphysematous changes and pulmonary scarring.

ABDOMEN/PELVIS: No abnormal hypermetabolic activity within the
liver, pancreas, adrenal glands, or spleen. No hypermetabolic lymph
nodes in the abdomen or pelvis.

Incidental CT findings: Advanced atherosclerotic calcifications
involving the aorta, iliac arteries and branch vessel ostia. No
focal aneurysm.

Surgical changes from a right inguinal hernia repair. No recurrent
hernia.

Simple appearing bilateral renal cysts. There is also a small
hyperdense/hemorrhagic cyst projecting off the lateral cortex of the
left kidney.

SKELETON: No focal hypermetabolic activity to suggest skeletal
metastasis.

Incidental CT findings: None
IMPRESSION: 1. 11 mm left lower lobe nodule does not demonstrate any
hypermetabolism. Recommend continued CT surveillance. A follow-up
noncontrast chest CT in 6 months is reasonable.
2. No enlarged or hypermetabolic mediastinal or hilar lymph nodes.
3. Stable advanced atherosclerotic disease involving the chest and
abdomen.
4. Stable emphysematous changes and pulmonary scarring. No new
pulmonary findings.

## 2021-10-13 ENCOUNTER — Telehealth: Payer: Self-pay | Admitting: Family Medicine

## 2021-10-13 NOTE — Telephone Encounter (Signed)
Copied from [place] [id number]. Topic: Medicare AWV >> Oct 13, 2021  1:50 PM Jae Dire wrote: Reason for CRM:  No answer unable to leave a message for patient to call back and schedule Medicare Annual Wellness Visit (AWV) in office.   If unable to come into the office for AWV,  please offer to do virtually or by telephone.  Last AWV: 10/26/2020  Please schedule at anytime with [facility].  30 minute appointment for Virtual or phone 45 minute appointment for in office or Initial virtual/phone  Any questions, please contact me at [phone]

## 2021-10-20 ENCOUNTER — Ambulatory Visit (INDEPENDENT_AMBULATORY_CARE_PROVIDER_SITE_OTHER): Payer: Medicare Other | Admitting: Family Medicine

## 2021-10-20 ENCOUNTER — Encounter: Payer: Self-pay | Admitting: Family Medicine

## 2021-10-20 VITALS — BP 110/86 | HR 57 | Resp 16 | Wt 131.0 lb

## 2021-10-20 DIAGNOSIS — I889 Nonspecific lymphadenitis, unspecified: Secondary | ICD-10-CM

## 2021-10-20 DIAGNOSIS — Z23 Encounter for immunization: Secondary | ICD-10-CM

## 2021-10-20 DIAGNOSIS — F17218 Nicotine dependence, cigarettes, with other nicotine-induced disorders: Secondary | ICD-10-CM

## 2021-10-20 DIAGNOSIS — J029 Acute pharyngitis, unspecified: Secondary | ICD-10-CM | POA: Diagnosis not present

## 2021-10-20 DIAGNOSIS — I10 Essential (primary) hypertension: Secondary | ICD-10-CM | POA: Diagnosis not present

## 2021-10-20 MED ORDER — AMOXICILLIN 500 MG PO CAPS: 500.0000 mg | ORAL_CAPSULE | Freq: Three times a day (TID) | ORAL | 0 refills | Status: AC

## 2021-10-20 NOTE — Progress Notes (Signed)
Established patient visit  I,Hunter Oconnor,acting as a scribe for Hunter Durie, MD.,have documented all relevant documentation on the behalf of Hunter Durie, MD,as directed by  Hunter Durie, MD while in the presence of Hunter Durie, MD.   Patient: Hunter Oconnor   DOB: [birth year]   71 y.o. Male  MRN: [id number] Visit Date: 10/20/2021  Today's healthcare provider: Wilhemena Durie, MD   Chief Complaint  Patient presents with   Follow-up   Hypertension   Subjective    HPI  She comes in today with cough and sore throat particularly in the left side of his throat.  He feels as though his nodes on the left side are swollen. No  Fever or hemoptysis.   Patient continues to smoke but otherwise feels well.  Hypertension, follow-up  BP Readings from Last 3 Encounters:  10/20/21 110/86  04/15/21 116/78  03/21/21 110/64   Wt Readings from Last 3 Encounters:  10/20/21 131 lb (59.4 kg)  04/15/21 135 lb (61.2 kg)  03/21/21 131 lb 1.6 oz (59.5 kg)     He was last seen for hypertension 7 months ago.  Management since that visit includes; not currently taking a medication. Patient was to monitor bp's at home.  Outside blood pressures are daughters checking..  Pertinent labs Lab Results  Component Value Date   CHOL 179 02/17/2021   HDL 49 02/17/2021   LDLCALC 115 (H) 02/17/2021   TRIG 82 02/17/2021   CHOLHDL 3.7 02/17/2021   Lab Results  Component Value Date   NA 141 02/17/2021   K 5.5 (H) 02/17/2021   CREATININE 1.10 02/17/2021   EGFR 71 02/17/2021   GLUCOSE 98 02/17/2021   TSH 2.980 02/17/2021     The 10-year ASCVD risk score (Arnett DK, et al., 2019) is: 18%  ---------------------------------------------------------------------------------------------------   Medications: Outpatient Medications Prior to Visit  Medication Sig   FLOVENT HFA 110 MCG/ACT inhaler INHALE 2 PUFFS BY MOUTH INTO THE LUNGS DAILY   levothyroxine (SYNTHROID) 150  MCG tablet TAKE 1 TABLET DAILY   meloxicam (MOBIC) 15 MG tablet TAKE 1 TABLET (15 MG TOTAL) BY MOUTH DAILY.   pravastatin (PRAVACHOL) 40 MG tablet Take 1 tablet (40 mg total) by mouth daily.   traZODone (DESYREL) 100 MG tablet TAKE 1 TABLET AT BEDTIME   No facility-administered medications prior to visit.    Review of Systems  Constitutional:  Negative for appetite change, chills and fever.  Respiratory:  Negative for chest tightness, shortness of breath and wheezing.   Cardiovascular:  Negative for chest pain and palpitations.  Gastrointestinal:  Negative for abdominal pain, nausea and vomiting.    Last lipids Lab Results  Component Value Date   CHOL 179 02/17/2021   HDL 49 02/17/2021   LDLCALC 115 (H) 02/17/2021   TRIG 82 02/17/2021   CHOLHDL 3.7 02/17/2021       Objective    BP 110/86 (BP Location: Right Arm, Patient Position: Sitting, Cuff Size: Normal)   Pulse (!) 57   Resp 16   Wt 131 lb (59.4 kg)   SpO2 97%   BMI 19.92 kg/m  BP Readings from Last 3 Encounters:  10/20/21 110/86  04/15/21 116/78  03/21/21 110/64   Wt Readings from Last 3 Encounters:  10/20/21 131 lb (59.4 kg)  04/15/21 135 lb (61.2 kg)  03/21/21 131 lb 1.6 oz (59.5 kg)      Physical Exam Vitals reviewed.  Constitutional:  General: He is not in acute distress.    Appearance: He is well-developed.  HENT:     Head: Normocephalic and atraumatic.     Right Ear: Hearing normal.     Left Ear: Hearing normal.     Nose: Nose normal.  Eyes:     General: Lids are normal. No scleral icterus.       Right eye: No discharge.        Left eye: No discharge.     Conjunctiva/sclera: Conjunctivae normal.  Neck:     Comments: Mildly enlarged left mandibular lymph node.  Tender but easily mobile. Cardiovascular:     Rate and Rhythm: Normal rate and regular rhythm.     Heart sounds: Normal heart sounds.  Pulmonary:     Effort: Pulmonary effort is normal. No respiratory distress.     Breath  sounds: Normal breath sounds.  Lymphadenopathy:     Cervical: Cervical adenopathy present.  Skin:    Findings: No lesion or rash.  Neurological:     General: No focal deficit present.     Mental Status: He is alert and oriented to person, place, and time.  Psychiatric:        Mood and Affect: Mood normal.        Speech: Speech normal.        Behavior: Behavior normal.        Thought Content: Thought content normal.        Judgment: Judgment normal.       No results found for any visits on 10/20/21.  Assessment & Plan     1. Primary hypertension Good blood pressure control.  2. Cigarette nicotine dependence with other nicotine-induced disorder Patient has been advised for years to cut back and quit  3. Pharyngitis, unspecified etiology Likely viral. Get home COVID test will when he leaves 4. Lymphadenitis Treat lymphadenitis with Amoxil 503 times a day for a week  5. Need for immunization against influenza  - Flu Vaccine QUAD High Dose(Fluad)   No follow-ups on file.      I, Hunter Durie, MD, have reviewed all documentation for this visit. The documentation on 10/25/21 for the exam, diagnosis, procedures, and orders are all accurate and complete.    Adalia Pettis Cranford Mon, MD  [facility] [phone] (phone) [phone] (fax)  [place]

## 2021-11-18 ENCOUNTER — Other Ambulatory Visit: Payer: Self-pay | Admitting: Family Medicine

## 2021-11-18 DIAGNOSIS — G4700 Insomnia, unspecified: Secondary | ICD-10-CM

## 2021-11-18 DIAGNOSIS — E039 Hypothyroidism, unspecified: Secondary | ICD-10-CM

## 2021-11-18 NOTE — Telephone Encounter (Signed)
Requested medication (s) are due for refill today: yes  Requested medication (s) are on the active medication list:yes  Last refill:  08/21/21  Future visit scheduled: no  Notes to clinic:  Unable to refill per protocol, last refill by provider no longer at practice, routing for review.     Requested Prescriptions  Pending Prescriptions Disp Refills   levothyroxine (SYNTHROID) 150 MCG tablet [Pharmacy Med Name: LEVOTHYROXIN TAB 150MCG] 90 tablet 1    Sig: TAKE 1 TABLET DAILY     Endocrinology:  Hypothyroid Agents Passed - 11/18/2021  9:08 AM      Passed - TSH in normal range and within 360 days    TSH  Date Value Ref Range Status  02/17/2021 2.980 0.450 - 4.500 uIU/mL Final         Passed - Valid encounter within last 12 months    Recent Outpatient Visits           4 weeks ago Primary hypertension   [facility] Jerrol Banana., MD   8 months ago White coat syndrome without diagnosis of hypertension   [facility] Thedore Mins, [place], PA-C   9 months ago Primary hypertension   [facility] Myles Gip, DO   1 year ago Tailbone injury, sequela   [facility] Tally Joe T, FNP   1 year ago ASCVD (arteriosclerotic cardiovascular disease)   [facility] Jerrol Banana., MD               traZODone (DESYREL) 100 MG tablet [Pharmacy Med Name: TRAZODONE TAB '100MG'$ ] 90 tablet 1    Sig: TAKE 1 TABLET AT BEDTIME     Psychiatry: Antidepressants - Serotonin Modulator Passed - 11/18/2021  9:08 AM      Passed - Valid encounter within last 6 months    Recent Outpatient Visits           4 weeks ago Primary hypertension   [facility] Jerrol Banana., MD   8 months ago White coat syndrome without diagnosis of hypertension   [facility] Thedore Mins, [place], PA-C   9 months ago Primary hypertension   [facility] Myles Gip, DO   1 year  ago Tailbone injury, sequela   [facility] Tally Joe T, FNP   1 year ago ASCVD (arteriosclerotic cardiovascular disease)   [facility] Jerrol Banana., MD

## 2021-12-02 DIAGNOSIS — M659 Synovitis and tenosynovitis, unspecified: Secondary | ICD-10-CM | POA: Diagnosis not present

## 2021-12-02 DIAGNOSIS — M25551 Pain in right hip: Secondary | ICD-10-CM | POA: Diagnosis not present

## 2021-12-02 DIAGNOSIS — M25541 Pain in joints of right hand: Secondary | ICD-10-CM | POA: Diagnosis not present

## 2021-12-02 DIAGNOSIS — G8929 Other chronic pain: Secondary | ICD-10-CM | POA: Diagnosis not present

## 2021-12-07 DIAGNOSIS — Z23 Encounter for immunization: Secondary | ICD-10-CM | POA: Diagnosis not present

## 2021-12-09 DIAGNOSIS — X32XXXA Exposure to sunlight, initial encounter: Secondary | ICD-10-CM | POA: Diagnosis not present

## 2021-12-09 DIAGNOSIS — L570 Actinic keratosis: Secondary | ICD-10-CM | POA: Diagnosis not present

## 2021-12-09 DIAGNOSIS — C44722 Squamous cell carcinoma of skin of right lower limb, including hip: Secondary | ICD-10-CM | POA: Diagnosis not present

## 2021-12-09 DIAGNOSIS — D2271 Melanocytic nevi of right lower limb, including hip: Secondary | ICD-10-CM | POA: Diagnosis not present

## 2021-12-09 DIAGNOSIS — L400 Psoriasis vulgaris: Secondary | ICD-10-CM | POA: Diagnosis not present

## 2021-12-09 DIAGNOSIS — Z85828 Personal history of other malignant neoplasm of skin: Secondary | ICD-10-CM | POA: Diagnosis not present

## 2021-12-09 DIAGNOSIS — L820 Inflamed seborrheic keratosis: Secondary | ICD-10-CM | POA: Diagnosis not present

## 2021-12-09 DIAGNOSIS — D2261 Melanocytic nevi of right upper limb, including shoulder: Secondary | ICD-10-CM | POA: Diagnosis not present

## 2021-12-09 DIAGNOSIS — L538 Other specified erythematous conditions: Secondary | ICD-10-CM | POA: Diagnosis not present

## 2021-12-09 DIAGNOSIS — D485 Neoplasm of uncertain behavior of skin: Secondary | ICD-10-CM | POA: Diagnosis not present

## 2022-01-01 DIAGNOSIS — M25551 Pain in right hip: Secondary | ICD-10-CM | POA: Diagnosis not present

## 2022-01-05 DIAGNOSIS — H353132 Nonexudative age-related macular degeneration, bilateral, intermediate dry stage: Secondary | ICD-10-CM | POA: Diagnosis not present

## 2022-01-12 DIAGNOSIS — M25551 Pain in right hip: Secondary | ICD-10-CM | POA: Diagnosis not present

## 2022-01-19 DIAGNOSIS — D2371 Other benign neoplasm of skin of right lower limb, including hip: Secondary | ICD-10-CM | POA: Diagnosis not present

## 2022-01-19 DIAGNOSIS — C44722 Squamous cell carcinoma of skin of right lower limb, including hip: Secondary | ICD-10-CM | POA: Diagnosis not present

## 2022-02-09 ENCOUNTER — Other Ambulatory Visit: Payer: Self-pay | Admitting: Family Medicine

## 2022-02-09 ENCOUNTER — Telehealth: Payer: Self-pay | Admitting: Family Medicine

## 2022-02-09 DIAGNOSIS — E7800 Pure hypercholesterolemia, unspecified: Secondary | ICD-10-CM

## 2022-02-09 MED ORDER — PRAVASTATIN SODIUM 40 MG PO TABS: 40.0000 mg | ORAL_TABLET | Freq: Every day | ORAL | 1 refills | Status: DC

## 2022-02-09 NOTE — Progress Notes (Unsigned)
I,Sha'taria Tyson,acting as a Education administrator for [employer], MD.,have documented all relevant documentation on the behalf of Eulis Foster, MD,as directed by  Eulis Foster, MD while in the presence of Eulis Foster, MD.  Established patient visit   Patient: Hunter Oconnor   DOB: March 12, 1950   72 y.o. Male  MRN: [id number] Visit Date: 02/10/2022  Today's healthcare provider: Eulis Foster, MD   No chief complaint on file.  Subjective    HPI  Hypothyroid, follow-up  Lab Results  Component Value Date   TSH 2.980 02/17/2021   TSH 14.700 (H) 02/09/2020   TSH 2.490 10/05/2019    Wt Readings from Last 3 Encounters:  02/10/22 133 lb 9.6 oz (60.6 kg)  10/20/21 131 lb (59.4 kg)  04/15/21 135 lb (61.2 kg)    He was last seen for hypothyroid 1 years ago.  Management since that visit includes continue current treatment. He reports excellent compliance with treatment. He is not having side effects. {document side effects if present:1}  Symptoms: No change in energy level No constipation  No diarrhea Yes heat / cold intolerance  No nervousness No palpitations  No weight changes    -----------------------------------------------------------------------------------------  Lipid/Cholesterol, Follow-up  Last lipid panel Other pertinent labs  Lab Results  Component Value Date   CHOL 179 02/17/2021   HDL 49 02/17/2021   LDLCALC 115 (H) 02/17/2021   TRIG 82 02/17/2021   CHOLHDL 3.7 02/17/2021   Lab Results  Component Value Date   ALT 13 02/09/2020   AST 14 02/09/2020   PLT 114 (L) 02/09/2020   TSH 2.980 02/17/2021     He was last seen for this 1 years ago.  Management since that visit includes continue current treatment.  He reports excellent compliance with treatment. He is not having side effects.  Symptoms: No chest pain No chest pressure/discomfort  No dyspnea No lower extremity edema  No numbness or tingling  of extremity No orthopnea  No palpitations No paroxysmal nocturnal dyspnea  No speech difficulty No syncope   Current diet:  sort of balanced Current exercise: none  The 10-year ASCVD risk score (Arnett DK, et al., 2019) is: 39.2%  ---------------------------------------------------------------------------------------------------   Medications: Outpatient Medications Prior to Visit  Medication Sig  . clobetasol ointment (TEMOVATE) 0.05 % Apply topically.  Marland Kitchen FLOVENT HFA 110 MCG/ACT inhaler INHALE 2 PUFFS BY MOUTH INTO THE LUNGS DAILY  . levothyroxine (SYNTHROID) 150 MCG tablet TAKE 1 TABLET DAILY  . meloxicam (MOBIC) 15 MG tablet TAKE 1 TABLET (15 MG TOTAL) BY MOUTH DAILY.  . traZODone (DESYREL) 100 MG tablet TAKE 1 TABLET AT BEDTIME  . [DISCONTINUED] pravastatin (PRAVACHOL) 40 MG tablet Take 1 tablet (40 mg total) by mouth daily.   No facility-administered medications prior to visit.    HTN - blood pressure is elevated, he denies HA, chest pain, or visual changes;  States that BP is often normal at home when his daughter checks his BP   Sometimes feels insides are jello like, he fells better after sugary snack, feels weak, nervous, hungry   Review of Systems  {Labs  Heme  Chem  Endocrine  Serology  Results Review (optional):23779}   Objective    BP (!) 180/98   Pulse 82   Ht '5\' 8"'$  (1.727 m)   Wt 133 lb 9.6 oz (60.6 kg)   SpO2 100%   BMI 20.31 kg/m  {Show previous vital signs (optional):23777}  Physical Exam  ***  No results found for  any visits on 02/10/22.  Assessment & Plan     Problem List Items Addressed This Visit       Respiratory   COPD (chronic obstructive pulmonary disease) ([place]) - Primary    Needs replacement for flovent **      Relevant Orders   Ambulatory Referral Lung [facility] Screening [facility]     Other   Hypercholesteremia   Relevant Medications   pravastatin (PRAVACHOL) 40 MG tablet   Tobacco abuse disorder   Relevant  Orders   Ambulatory Referral Lung Cancer Screening [facility]     Return in about 4 weeks (around 03/10/2022).      I, Eulis Foster, MD, have reviewed all documentation for this visit.  Portions of this information were initially documented by the CMA and reviewed by me for thoroughness and accuracy.      Eulis Foster, MD  [facility] [phone] (phone) [phone] (fax)  [place]

## 2022-02-10 ENCOUNTER — Ambulatory Visit (INDEPENDENT_AMBULATORY_CARE_PROVIDER_SITE_OTHER): Payer: Medicare Other | Admitting: Family Medicine

## 2022-02-10 ENCOUNTER — Other Ambulatory Visit: Payer: Self-pay | Admitting: Family Medicine

## 2022-02-10 ENCOUNTER — Encounter: Payer: Self-pay | Admitting: Family Medicine

## 2022-02-10 VITALS — BP 180/98 | HR 82 | Ht 68.0 in | Wt 133.6 lb

## 2022-02-10 DIAGNOSIS — R030 Elevated blood-pressure reading, without diagnosis of hypertension: Secondary | ICD-10-CM | POA: Diagnosis not present

## 2022-02-10 DIAGNOSIS — E7800 Pure hypercholesterolemia, unspecified: Secondary | ICD-10-CM

## 2022-02-10 DIAGNOSIS — J449 Chronic obstructive pulmonary disease, unspecified: Secondary | ICD-10-CM

## 2022-02-10 DIAGNOSIS — Z720 Tobacco use: Secondary | ICD-10-CM

## 2022-02-10 MED ORDER — PRAVASTATIN SODIUM 40 MG PO TABS: 40.0000 mg | ORAL_TABLET | Freq: Every day | ORAL | 1 refills | Status: AC

## 2022-02-10 MED ORDER — BUDESONIDE-FORMOTEROL FUMARATE 80-4.5 MCG/ACT IN AERO: 2.0000 | INHALATION_SPRAY | Freq: Two times a day (BID) | RESPIRATORY_TRACT | 3 refills | Status: DC

## 2022-02-10 NOTE — Assessment & Plan Note (Signed)
Needs replacement for flovent due to insurance coverage changes  Will replace with symbicort 80-4.5 inhaler and have patient ask for insurance preferred medication if this is not covered

## 2022-02-11 NOTE — Assessment & Plan Note (Signed)
BP elevated on multiple measurements in office today  He is on no current medications for HTN  Will have patient continue to record BP for next two weeks and follow up for virtual appointment

## 2022-02-11 NOTE — Assessment & Plan Note (Signed)
Stable.  Continue pravastatin 40 mg daily. 

## 2022-02-11 NOTE — Assessment & Plan Note (Signed)
Re-emphasized importance of smoking cessation  Patient reports cutting back on amount of tobacco  Acknowledges past converations with PCP regarding smoking cessation  States that now 1 Pack last 2-3 days compared to 1 PPD

## 2022-02-12 NOTE — Telephone Encounter (Signed)
Opened in error

## 2022-02-15 DIAGNOSIS — R109 Unspecified abdominal pain: Secondary | ICD-10-CM | POA: Diagnosis not present

## 2022-02-15 DIAGNOSIS — M545 Low back pain, unspecified: Secondary | ICD-10-CM | POA: Diagnosis not present

## 2022-02-15 DIAGNOSIS — R1031 Right lower quadrant pain: Secondary | ICD-10-CM | POA: Diagnosis not present

## 2022-02-15 DIAGNOSIS — M5136 Other intervertebral disc degeneration, lumbar region: Secondary | ICD-10-CM | POA: Diagnosis not present

## 2022-02-15 DIAGNOSIS — M5137 Other intervertebral disc degeneration, lumbosacral region: Secondary | ICD-10-CM | POA: Diagnosis not present

## 2022-02-16 ENCOUNTER — Telehealth: Payer: Self-pay | Admitting: Family Medicine

## 2022-02-16 DIAGNOSIS — I714 Abdominal aortic aneurysm, without rupture, unspecified: Secondary | ICD-10-CM

## 2022-02-16 MED ORDER — ARNUITY ELLIPTA 100 MCG/ACT IN AEPB: 100.0000 ug | INHALATION_SPRAY | Freq: Every day | RESPIRATORY_TRACT | 6 refills | Status: AC

## 2022-02-16 NOTE — Telephone Encounter (Signed)
Prescription for Arnuity inhaler 100 mcg once daily submitted to [facility] [place], [place] [facility]

## 2022-02-16 NOTE — Telephone Encounter (Signed)
Pts wife is calling to discuss results from [facility] clinic UC walkin. X rays where taken for back pain.Pt & wife do not understand the results. Abdominal Aorta A. 3.23cm   Further evaluation with Abdominal Aortia  [place] should be performed.  Please advise CB- [phone]

## 2022-02-16 NOTE — Telephone Encounter (Signed)
Pts wife is calling to report that a referral was placed for PET scan in [place], [place]. Wanting referral to be placed at [facility].  Please advise CB- [phone]

## 2022-02-16 NOTE — Telephone Encounter (Signed)
Unable to review results personally as image is not currently loading nor is results.   Based on telephone note, it appears that there is an abnormal area of enlargement in the aorta.   We will order abdominal ultrasound that has been recommended based on telephone encounter note    Hunter Foster, MD  [facility]

## 2022-02-16 NOTE — Telephone Encounter (Signed)
Pts wife is calling to report that the pt insurance no longer is covering [place] HFA 110 MCG/ACT inhaler [[id number]]  DISCONTINUED. Calling to report the below approved inhalers:  Arnuity (cheapest) Pulmicort  Please advise

## 2022-02-17 NOTE — Telephone Encounter (Signed)
Patient advised.

## 2022-02-20 ENCOUNTER — Telehealth: Payer: Self-pay | Admitting: Family Medicine

## 2022-02-20 NOTE — Telephone Encounter (Signed)
Prescription for pravastatin '40mg'$  [facility] on [facility].   Eulis Foster, MD  [facility]

## 2022-02-20 NOTE — Telephone Encounter (Signed)
[facility] pharmacy faxed refill request for the following medications:   pravastatin (PRAVACHOL) 40 MG tablet    Please advise

## 2022-02-25 ENCOUNTER — Other Ambulatory Visit: Payer: Self-pay | Admitting: Family Medicine

## 2022-02-25 ENCOUNTER — Telehealth: Payer: Medicare Other | Admitting: Family Medicine

## 2022-02-25 DIAGNOSIS — E7800 Pure hypercholesterolemia, unspecified: Secondary | ICD-10-CM

## 2022-02-25 NOTE — Telephone Encounter (Signed)
Please contact patient to clarify if he has received prescription for pravastatin '40mg'$ . [facility]. Which pharmacy does he prefer?   Eulis Foster, MD  [facility]

## 2022-02-25 NOTE — Telephone Encounter (Signed)
Spoke with patient's wife and reports that they prefer [facility] but that she picked up the [facility]atin [facility] on [facility]/10/2022.

## 2022-02-25 NOTE — Telephone Encounter (Signed)
[facility]  Note [facility] faxed refill request for the following medications:    pravastatin (PRAVACHOL) 40 MG tablet      Please advise

## 2022-03-06 DIAGNOSIS — E039 Hypothyroidism, unspecified: Secondary | ICD-10-CM | POA: Diagnosis not present

## 2022-03-06 DIAGNOSIS — R739 Hyperglycemia, unspecified: Secondary | ICD-10-CM | POA: Diagnosis not present

## 2022-03-06 DIAGNOSIS — J449 Chronic obstructive pulmonary disease, unspecified: Secondary | ICD-10-CM | POA: Diagnosis not present

## 2022-03-06 DIAGNOSIS — E7800 Pure hypercholesterolemia, unspecified: Secondary | ICD-10-CM | POA: Diagnosis not present

## 2022-03-06 DIAGNOSIS — Z136 Encounter for screening for cardiovascular disorders: Secondary | ICD-10-CM | POA: Diagnosis not present

## 2022-03-06 DIAGNOSIS — R030 Elevated blood-pressure reading, without diagnosis of hypertension: Secondary | ICD-10-CM | POA: Diagnosis not present

## 2022-03-06 DIAGNOSIS — Z0000 Encounter for general adult medical examination without abnormal findings: Secondary | ICD-10-CM | POA: Diagnosis not present

## 2022-03-18 ENCOUNTER — Other Ambulatory Visit: Payer: Self-pay | Admitting: Family Medicine

## 2022-03-18 DIAGNOSIS — I712 Thoracic aortic aneurysm, without rupture, unspecified: Secondary | ICD-10-CM

## 2022-03-18 DIAGNOSIS — Z136 Encounter for screening for cardiovascular disorders: Secondary | ICD-10-CM

## 2022-03-26 DIAGNOSIS — Z23 Encounter for immunization: Secondary | ICD-10-CM | POA: Diagnosis not present

## 2022-03-27 ENCOUNTER — Ambulatory Visit
Admission: RE | Admit: 2022-03-27 | Discharge: 2022-03-27 | Disposition: A | Discharge status: Home / Self Care | Payer: Medicare Other | Source: Ambulatory Visit | Attending: Family Medicine | Admitting: Family Medicine

## 2022-03-27 ENCOUNTER — Ambulatory Visit: Payer: Medicare Other | Attending: Family Medicine

## 2022-03-27 DIAGNOSIS — I714 Abdominal aortic aneurysm, without rupture, unspecified: Secondary | ICD-10-CM | POA: Diagnosis not present

## 2022-03-27 DIAGNOSIS — Z720 Tobacco use: Secondary | ICD-10-CM | POA: Diagnosis not present

## 2022-03-27 DIAGNOSIS — J441 Chronic obstructive pulmonary disease with (acute) exacerbation: Secondary | ICD-10-CM | POA: Insufficient documentation

## 2022-03-27 DIAGNOSIS — F17210 Nicotine dependence, cigarettes, uncomplicated: Secondary | ICD-10-CM | POA: Diagnosis not present

## 2022-03-27 DIAGNOSIS — I7121 Aneurysm of the ascending aorta, without rupture: Secondary | ICD-10-CM

## 2022-04-21 ENCOUNTER — Other Ambulatory Visit: Payer: Self-pay | Admitting: Family Medicine

## 2022-04-21 DIAGNOSIS — E039 Hypothyroidism, unspecified: Secondary | ICD-10-CM

## 2022-04-21 DIAGNOSIS — G4700 Insomnia, unspecified: Secondary | ICD-10-CM

## 2022-04-21 NOTE — Telephone Encounter (Signed)
Patient no longer at practice-sees Dr Rosanna Randy Requested Prescriptions  Pending Prescriptions Disp Refills   levothyroxine (SYNTHROID) 150 MCG tablet [Pharmacy Med Name: L-THYROXINE (SYNTHROID) TABS 150MCG] 90 tablet 3    Sig: TAKE 1 TABLET DAILY     Endocrinology:  Hypothyroid Agents Failed - 04/21/2022  7:12 AM      Failed - TSH in normal range and within 360 days    TSH  Date Value Ref Range Status  02/17/2021 2.980 0.450 - 4.500 uIU/mL Final         Passed - Valid encounter within last 12 months    Recent Outpatient Visits           2 months ago Chronic obstructive pulmonary disease, unspecified COPD type ([place])   [place] Simmons-Robinson, [place], MD   6 months ago Primary hypertension   [place] Eulas Post, MD   1 year ago White coat syndrome without diagnosis of hypertension   [place] Mikey Kirschner, PA-C   1 year ago Primary hypertension   [place], [place], DO   1 year ago Tailbone injury, sequela   [place] Gwyneth Sprout, FNP               traZODone (DESYREL) 100 MG tablet [Pharmacy Med Name: TRAZODONE HCL TABS 100MG ] 90 tablet 3    Sig: TAKE 1 TABLET AT BEDTIME     Psychiatry: Antidepressants - Serotonin Modulator Passed - 04/21/2022  7:12 AM      Passed - Valid encounter within last 6 months    Recent Outpatient Visits           2 months ago Chronic obstructive pulmonary disease, unspecified COPD type ([place])   [place] Simmons-Robinson, [place], MD   6 months ago Primary hypertension   [place] Eulas Post, MD   1 year ago White coat syndrome without diagnosis of hypertension   [place] Mikey Kirschner, PA-C   1 year ago Primary hypertension   [place],  [place], DO   1 year ago Tailbone injury, sequela   [place] Gwyneth Sprout, 

## 2022-05-08 DIAGNOSIS — M1811 Unilateral primary osteoarthritis of first carpometacarpal joint, right hand: Secondary | ICD-10-CM | POA: Diagnosis not present

## 2022-05-14 ENCOUNTER — Other Ambulatory Visit: Payer: Self-pay | Admitting: Family Medicine

## 2022-05-14 DIAGNOSIS — I714 Abdominal aortic aneurysm, without rupture, unspecified: Secondary | ICD-10-CM

## 2022-05-29 ENCOUNTER — Other Ambulatory Visit: Payer: Self-pay

## 2022-05-29 DIAGNOSIS — F17210 Nicotine dependence, cigarettes, uncomplicated: Secondary | ICD-10-CM

## 2022-05-29 DIAGNOSIS — Z87891 Personal history of nicotine dependence: Secondary | ICD-10-CM

## 2022-06-15 ENCOUNTER — Other Ambulatory Visit: Payer: Medicare Other

## 2022-07-24 DIAGNOSIS — H40013 Open angle with borderline findings, low risk, bilateral: Secondary | ICD-10-CM | POA: Diagnosis not present

## 2022-08-11 DIAGNOSIS — D225 Melanocytic nevi of trunk: Secondary | ICD-10-CM | POA: Diagnosis not present

## 2022-08-11 DIAGNOSIS — L400 Psoriasis vulgaris: Secondary | ICD-10-CM | POA: Diagnosis not present

## 2022-08-11 DIAGNOSIS — Z85828 Personal history of other malignant neoplasm of skin: Secondary | ICD-10-CM | POA: Diagnosis not present

## 2022-08-11 DIAGNOSIS — L720 Epidermal cyst: Secondary | ICD-10-CM | POA: Diagnosis not present

## 2022-08-11 DIAGNOSIS — L821 Other seborrheic keratosis: Secondary | ICD-10-CM | POA: Diagnosis not present

## 2022-09-08 DIAGNOSIS — N401 Enlarged prostate with lower urinary tract symptoms: Secondary | ICD-10-CM | POA: Diagnosis not present

## 2022-09-08 DIAGNOSIS — F17210 Nicotine dependence, cigarettes, uncomplicated: Secondary | ICD-10-CM | POA: Diagnosis not present

## 2022-09-08 DIAGNOSIS — E039 Hypothyroidism, unspecified: Secondary | ICD-10-CM | POA: Diagnosis not present

## 2022-09-08 DIAGNOSIS — R252 Cramp and spasm: Secondary | ICD-10-CM | POA: Diagnosis not present

## 2022-09-08 DIAGNOSIS — R739 Hyperglycemia, unspecified: Secondary | ICD-10-CM | POA: Diagnosis not present

## 2022-09-08 DIAGNOSIS — R350 Frequency of micturition: Secondary | ICD-10-CM | POA: Diagnosis not present

## 2022-09-08 DIAGNOSIS — I714 Abdominal aortic aneurysm, without rupture, unspecified: Secondary | ICD-10-CM | POA: Diagnosis not present

## 2022-11-12 DIAGNOSIS — E039 Hypothyroidism, unspecified: Secondary | ICD-10-CM | POA: Diagnosis not present

## 2022-11-12 DIAGNOSIS — R350 Frequency of micturition: Secondary | ICD-10-CM | POA: Diagnosis not present

## 2022-11-12 DIAGNOSIS — E7800 Pure hypercholesterolemia, unspecified: Secondary | ICD-10-CM | POA: Diagnosis not present

## 2022-11-12 DIAGNOSIS — J432 Centrilobular emphysema: Secondary | ICD-10-CM | POA: Diagnosis not present

## 2022-11-12 DIAGNOSIS — R3912 Poor urinary stream: Secondary | ICD-10-CM | POA: Diagnosis not present

## 2022-11-12 DIAGNOSIS — R351 Nocturia: Secondary | ICD-10-CM | POA: Diagnosis not present

## 2022-11-12 DIAGNOSIS — N401 Enlarged prostate with lower urinary tract symptoms: Secondary | ICD-10-CM | POA: Diagnosis not present

## 2022-11-12 DIAGNOSIS — F17210 Nicotine dependence, cigarettes, uncomplicated: Secondary | ICD-10-CM | POA: Diagnosis not present

## 2022-11-18 DIAGNOSIS — Z23 Encounter for immunization: Secondary | ICD-10-CM | POA: Diagnosis not present

## 2022-12-28 ENCOUNTER — Ambulatory Visit: Payer: Medicare Other | Admitting: Urology

## 2022-12-28 ENCOUNTER — Encounter: Payer: Self-pay | Admitting: Urology

## 2022-12-28 VITALS — BP 186/103 | HR 46 | Ht 68.0 in | Wt 130.0 lb

## 2022-12-28 DIAGNOSIS — R399 Unspecified symptoms and signs involving the genitourinary system: Secondary | ICD-10-CM | POA: Diagnosis not present

## 2022-12-28 DIAGNOSIS — R3129 Other microscopic hematuria: Secondary | ICD-10-CM

## 2022-12-28 DIAGNOSIS — N401 Enlarged prostate with lower urinary tract symptoms: Secondary | ICD-10-CM | POA: Diagnosis not present

## 2022-12-28 LAB — URINALYSIS, COMPLETE
Bilirubin, UA: NEGATIVE
Glucose, UA: NEGATIVE
Ketones, UA: NEGATIVE
Leukocytes,UA: NEGATIVE
Nitrite, UA: NEGATIVE
Specific Gravity, UA: 1.02 (ref 1.005–1.030)
Urobilinogen, Ur: 0.2 mg/dL (ref 0.2–1.0)
pH, UA: 6 (ref 5.0–7.5)

## 2022-12-28 LAB — MICROSCOPIC EXAMINATION: Bacteria, UA: NONE SEEN

## 2022-12-28 MED ORDER — GEMTESA 75 MG PO TABS: 75.0000 mg | ORAL_TABLET | Freq: Every day | ORAL | Status: DC

## 2022-12-28 NOTE — Progress Notes (Signed)
I, Hunter Oconnor, acting as a scribe for Riki Altes, MD., have documented all relevant documentation on the behalf of Riki Altes, MD, as directed by Riki Altes, MD while in the presence of Riki Altes, MD.  12/28/2022 11:59 AM   Hunter Oconnor [birth year] [id number]  Referring provider: Bosie Clos, MD [address] [place],  [place] [address]  Chief Complaint  Patient presents with   Benign Prostatic Hypertrophy    HPI: Hunter Oconnor is a 72 y.o. male referred for evaluation of a BPH.   Onset of bothersome urinary frequency and urgency August 2024. He was given a trial of tamsulosin and on follow-up October 2024, had had no significant improvement in his symptoms and he is no longer taking this medication.  Most bothersome symptoms are frequency and urgency. He does have a weak urinary stream, more than half the time and occasional episodes of urge incontinence.  IPSS completed today was 18/35 with a bother rated 4 on a 0-6 scale.  Denies dysuria, gross hematuria.  No flank, abdominal or pelvic pain.   PMH: Past Medical History:  Diagnosis Date   CAFL (chronic airflow limitation) (HCC) 07/10/2014   COPD (chronic obstructive pulmonary disease) (HCC)    Hernia of abdominal wall    Hyperlipidemia    Hypothyroid    Insomnia     Surgical History: Past Surgical History:  Procedure Laterality Date   APPENDECTOMY     BACK SURGERY     neck     CERVICAL DISC SURGERY  2008   Ruptured disc   COLONOSCOPY N/A 08/27/2020   Procedure: COLONOSCOPY;  Surgeon: Wyline Mood, MD;  Location: [facility] ENDOSCOPY;  Service: Gastroenterology;  Laterality: N/A;   COLONOSCOPY WITH PROPOFOL N/A 12/11/2019   Procedure: COLONOSCOPY WITH PROPOFOL;  Surgeon: Wyline Mood, MD;  Location: [facility] ENDOSCOPY;  Service: Gastroenterology;  Laterality: N/A;   COLONOSCOPY WITH PROPOFOL N/A 04/15/2021   Procedure: COLONOSCOPY WITH PROPOFOL;  Surgeon: Midge Minium, MD;  Location: [facility]  ENDOSCOPY;  Service: Endoscopy;  Laterality: N/A;   HERNIA REPAIR     INGUINAL HERNIA REPAIR Right    SHOULDER SURGERY     XI ROBOTIC ASSISTED INGUINAL HERNIA REPAIR WITH MESH Left 11/10/2019   Procedure: XI ROBOTIC ASSISTED INGUINAL HERNIA REPAIR WITH MESH;  Surgeon: Campbell Lerner, MD;  Location: [facility] ORS;  Service: General;  Laterality: Left;    Home Medications:  Allergies as of 12/28/2022   No Known Allergies      Medication List        Accurate as of December 28, 2022 11:59 AM. If you have any questions, ask your nurse or doctor.          Arnuity Ellipta 100 MCG/ACT Aepb Generic drug: Fluticasone Furoate Inhale 100 mcg into the lungs daily.   clobetasol ointment 0.05 % Commonly known as: TEMOVATE Apply topically.   Gemtesa 75 MG Tabs Generic drug: Vibegron Take 1 tablet (75 mg total) by mouth daily. Started by: Riki Altes   levothyroxine 150 MCG tablet Commonly known as: SYNTHROID TAKE 1 TABLET DAILY   meloxicam 15 MG tablet Commonly known as: MOBIC TAKE 1 TABLET (15 MG TOTAL) BY MOUTH DAILY.   pravastatin 40 MG tablet Commonly known as: PRAVACHOL Take 1 tablet (40 mg total) by mouth daily.   traZODone 100 MG tablet Commonly known as: DESYREL TAKE 1 TABLET AT BEDTIME        Allergies: No Known Allergies  Family History: Family History  Problem Relation Age of Onset   Cancer Mother        colon ca   Hypertension Father    Cancer Father     Social History:  reports that he has been smoking cigarettes. He has a 53 pack-year smoking history. He has never used smokeless tobacco. He reports current alcohol use. He reports that he does not use drugs.   Physical Exam: BP (!) 186/103   Pulse (!) 46   Ht 5\' 8"  (1.727 m)   Wt 130 lb (59 kg)   BMI 19.77 kg/m   Constitutional:  Alert and oriented, No acute distress. HEENT: [place] AT Respiratory: Normal respiratory effort, no increased work of breathing. GU: Prostate 50 grams, flat smooth  without nodules. Psychiatric: Normal mood and affect.   Urinalysis Dipstick trace blood/2+ protein, microscopy 3-10 RBC.   Assessment & Plan:    1. Lower urinary tract symptoms Most bothersome voiding symptoms are storage related.  No improvement on tamsulosin PVR today 0 mL.  Trial of Gemtesa 75 mg daily- samples given.   2. Microhematuria We discussed the potential abnormalities causing microhematuria that could also contribute to his voiding symptoms including non-obstructing distal ureteral calculi, bladder calculi, and bladder cancer.  AUA hematuria risk stratification: high.  We discussed the recommended evaluation of high-risk hematuria which includes CT urogram and cystoscopy. All questions are answered, and he desires to proceed with further evaluation.   I have reviewed the above documentation for accuracy and completeness, and I agree with the above.   Riki Altes, MD  [facility] [address] [place], [place] [address] [phone]

## 2023-01-04 ENCOUNTER — Observation Stay
Admission: EM | Admit: 2023-01-04 | Discharge: 2023-01-05 | Disposition: A | Discharge status: Home / Self Care | Payer: Medicare Other | Attending: Internal Medicine | Admitting: Internal Medicine

## 2023-01-04 ENCOUNTER — Other Ambulatory Visit: Payer: Self-pay

## 2023-01-04 ENCOUNTER — Emergency Department: Payer: Medicare Other

## 2023-01-04 ENCOUNTER — Ambulatory Visit
Admission: RE | Admit: 2023-01-04 | Discharge: 2023-01-04 | Disposition: A | Discharge status: Home / Self Care | Payer: Medicare Other | Source: Ambulatory Visit | Attending: Urology | Admitting: Urology

## 2023-01-04 DIAGNOSIS — I6503 Occlusion and stenosis of bilateral vertebral arteries: Secondary | ICD-10-CM | POA: Diagnosis not present

## 2023-01-04 DIAGNOSIS — N401 Enlarged prostate with lower urinary tract symptoms: Secondary | ICD-10-CM | POA: Insufficient documentation

## 2023-01-04 DIAGNOSIS — I700 Atherosclerosis of aorta: Secondary | ICD-10-CM | POA: Diagnosis not present

## 2023-01-04 DIAGNOSIS — I672 Cerebral atherosclerosis: Secondary | ICD-10-CM | POA: Diagnosis not present

## 2023-01-04 DIAGNOSIS — R350 Frequency of micturition: Secondary | ICD-10-CM | POA: Diagnosis not present

## 2023-01-04 DIAGNOSIS — J449 Chronic obstructive pulmonary disease, unspecified: Secondary | ICD-10-CM | POA: Diagnosis not present

## 2023-01-04 DIAGNOSIS — G453 Amaurosis fugax: Secondary | ICD-10-CM | POA: Diagnosis not present

## 2023-01-04 DIAGNOSIS — R001 Bradycardia, unspecified: Secondary | ICD-10-CM | POA: Diagnosis present

## 2023-01-04 DIAGNOSIS — H5462 Unqualified visual loss, left eye, normal vision right eye: Principal | ICD-10-CM | POA: Diagnosis present

## 2023-01-04 DIAGNOSIS — F17210 Nicotine dependence, cigarettes, uncomplicated: Secondary | ICD-10-CM | POA: Diagnosis not present

## 2023-01-04 DIAGNOSIS — R29818 Other symptoms and signs involving the nervous system: Secondary | ICD-10-CM | POA: Insufficient documentation

## 2023-01-04 DIAGNOSIS — N400 Enlarged prostate without lower urinary tract symptoms: Secondary | ICD-10-CM | POA: Diagnosis not present

## 2023-01-04 DIAGNOSIS — E785 Hyperlipidemia, unspecified: Secondary | ICD-10-CM | POA: Diagnosis present

## 2023-01-04 DIAGNOSIS — E039 Hypothyroidism, unspecified: Secondary | ICD-10-CM | POA: Diagnosis present

## 2023-01-04 DIAGNOSIS — R3129 Other microscopic hematuria: Secondary | ICD-10-CM | POA: Insufficient documentation

## 2023-01-04 DIAGNOSIS — R319 Hematuria, unspecified: Secondary | ICD-10-CM | POA: Diagnosis not present

## 2023-01-04 DIAGNOSIS — R030 Elevated blood-pressure reading, without diagnosis of hypertension: Secondary | ICD-10-CM | POA: Diagnosis present

## 2023-01-04 DIAGNOSIS — G4700 Insomnia, unspecified: Secondary | ICD-10-CM | POA: Diagnosis present

## 2023-01-04 DIAGNOSIS — I723 Aneurysm of iliac artery: Secondary | ICD-10-CM | POA: Diagnosis not present

## 2023-01-04 DIAGNOSIS — D696 Thrombocytopenia, unspecified: Secondary | ICD-10-CM | POA: Diagnosis not present

## 2023-01-04 DIAGNOSIS — I719 Aortic aneurysm of unspecified site, without rupture: Secondary | ICD-10-CM | POA: Diagnosis not present

## 2023-01-04 DIAGNOSIS — Z79899 Other long term (current) drug therapy: Secondary | ICD-10-CM | POA: Diagnosis not present

## 2023-01-04 DIAGNOSIS — Z720 Tobacco use: Secondary | ICD-10-CM | POA: Diagnosis not present

## 2023-01-04 LAB — CBC
HCT: 44.1 % (ref 39.0–52.0)
Hemoglobin: 14.9 g/dL (ref 13.0–17.0)
MCH: 33.2 pg (ref 26.0–34.0)
MCHC: 33.8 g/dL (ref 30.0–36.0)
MCV: 98.2 fL (ref 80.0–100.0)
Platelets: 101 10*3/uL — ABNORMAL LOW (ref 150–400)
RBC: 4.49 MIL/uL (ref 4.22–5.81)
RDW: 14.1 % (ref 11.5–15.5)
WBC: 7.8 10*3/uL (ref 4.0–10.5)
nRBC: 0 % (ref 0.0–0.2)

## 2023-01-04 LAB — PROTIME-INR
INR: 1 (ref 0.8–1.2)
Prothrombin Time: 13.6 s (ref 11.4–15.2)

## 2023-01-04 LAB — COMPREHENSIVE METABOLIC PANEL
ALT: 16 U/L (ref 0–44)
AST: 18 U/L (ref 15–41)
Albumin: 3.7 g/dL (ref 3.5–5.0)
Alkaline Phosphatase: 73 U/L (ref 38–126)
Anion gap: 9 (ref 5–15)
BUN: 26 mg/dL — ABNORMAL HIGH (ref 8–23)
CO2: 27 mmol/L (ref 22–32)
Calcium: 8.7 mg/dL — ABNORMAL LOW (ref 8.9–10.3)
Chloride: 103 mmol/L (ref 98–111)
Creatinine, Ser: 1.17 mg/dL (ref 0.61–1.24)
GFR, Estimated: >60 mL/min (ref >60)
Glucose, Bld: 123 mg/dL — ABNORMAL HIGH (ref 70–99)
Potassium: 4 mmol/L (ref 3.5–5.1)
Sodium: 139 mmol/L (ref 135–145)
Total Bilirubin: 0.5 mg/dL (ref <1.2)
Total Protein: 6.5 g/dL (ref 6.5–8.1)

## 2023-01-04 LAB — DIFFERENTIAL
Abs Immature Granulocytes: 0.04 10*3/uL (ref 0.00–0.07)
Basophils Absolute: 0.1 10*3/uL (ref 0.0–0.1)
Basophils Relative: 1 %
Eosinophils Absolute: 0.7 10*3/uL — ABNORMAL HIGH (ref 0.0–0.5)
Eosinophils Relative: 9 %
Immature Granulocytes: 1 %
Lymphocytes Relative: 25 %
Lymphs Abs: 2 10*3/uL (ref 0.7–4.0)
Monocytes Absolute: 0.5 10*3/uL (ref 0.1–1.0)
Monocytes Relative: 6 %
Neutro Abs: 4.5 10*3/uL (ref 1.7–7.7)
Neutrophils Relative %: 58 %

## 2023-01-04 LAB — APTT: aPTT: 33 s (ref 24–36)

## 2023-01-04 LAB — CBG MONITORING, ED: Glucose-Capillary: 124 mg/dL — ABNORMAL HIGH (ref 70–99)

## 2023-01-04 LAB — ETHANOL: Alcohol, Ethyl (B): <10 mg/dL (ref <10)

## 2023-01-04 MED ORDER — IOHEXOL 350 MG/ML SOLN
75.0000 mL | Freq: Once | INTRAVENOUS | Status: AC | PRN
  Administered 2023-01-04: 75 mL via INTRAVENOUS

## 2023-01-04 MED ORDER — IOHEXOL 300 MG/ML  SOLN
100.0000 mL | Freq: Once | INTRAMUSCULAR | Status: AC | PRN
  Administered 2023-01-04: 100 mL via INTRAVENOUS

## 2023-01-04 MED ORDER — ALBUTEROL SULFATE (2.5 MG/3ML) 0.083% IN NEBU
2.5000 mg | INHALATION_SOLUTION | RESPIRATORY_TRACT | Status: DC | PRN
  Filled 2023-01-04: qty 3

## 2023-01-04 MED ORDER — DM-GUAIFENESIN ER 30-600 MG PO TB12: 1.0000 | ORAL_TABLET | Freq: Two times a day (BID) | ORAL | Status: DC | PRN

## 2023-01-04 MED ORDER — NICOTINE 21 MG/24HR TD PT24
21.0000 mg | MEDICATED_PATCH | Freq: Every day | TRANSDERMAL | Status: DC
  Administered 2023-01-05: 21 mg via TRANSDERMAL
  Filled 2023-01-04: qty 1

## 2023-01-04 MED ORDER — ONDANSETRON HCL 4 MG/2ML IJ SOLN: 4.0000 mg | Freq: Three times a day (TID) | INTRAMUSCULAR | Status: DC | PRN

## 2023-01-04 MED ORDER — SODIUM CHLORIDE 0.9% FLUSH
3.0000 mL | Freq: Once | INTRAVENOUS | Status: AC
  Administered 2023-01-04: 3 mL via INTRAVENOUS

## 2023-01-04 MED ORDER — ATORVASTATIN CALCIUM 20 MG PO TABS
80.0000 mg | ORAL_TABLET | Freq: Every day | ORAL | Status: DC
  Filled 2023-01-04: qty 4

## 2023-01-04 NOTE — H&P (Signed)
History and Physical    Hunter Oconnor:[id number] DOB: 11/18/1950 DOA: 01/04/2023  Referring MD/NP/PA:   PCP: Bosie Clos, MD   Patient coming from:  The patient is coming from home.     Chief Complaint: left eye vision loss.  HPI: Hunter Oconnor is a 72 y.o. 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  Patient states that he had sudden left eye vision loss at about 9:45 when he was watching TV at home.  It lasted for about 1 minutes, then completely resolved. The patient states that he stood up, got somewhat lightheaded, and then noted that his left eye vision had turned completely gray on that side. No eye pain.  No headache.  Denies unilateral numbness or tingling in extremities.  No facial droop or slurred speech. His right eye is normal.  Denies seeing flash. Patient does not have chest pain, SOB, cough.  No nausea, vomiting, diarrhea or abdominal pain.  Patient recently saw Dr. Lonna Cobb of urology due to BPH.  Patient has microcytic hematuria.  CT per hematuria protocol was negative for kidney stone or mass.  Patient reports mild urinary frequency, little dysuria, no burning on urination.  No gross hematuria.  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  His initial blood pressure 212/73, which improved to 145/78 without treatment in the ED.  Patient has bradycardia on telemetry in ED, with heart rate 48-50s.  Data reviewed independently and ED Course: pt was found to have WBC 7.6, GFR> 60, INR 1.0, PTT 33, temperature normal, RR 18, oxygen saturation 98% on room air.  CT of head is negative for acute intracranial abnormalities.  CTA of head and neck negative for LVO.  Patient is placed on telemetry bed for observation.   EKG: I have personally  reviewed.  Sinus rhythm, QTc 398, LAD, poor R wave progression, incomplete right bundle blockage.   Review of Systems:   General: no fevers, chills, no body weight gain, fatigue HEENT: no blurry vision, hearing changes or sore throat Respiratory: no dyspnea, coughing, wheezing CV: no chest pain, no palpitations GI: no nausea, vomiting, abdominal pain, diarrhea, constipation GU: no dysuria, burning on urination, increased urinary frequency, hematuria  Ext: no leg edema Neuro: no unilateral weakness, numbness, or tingling, no hearing loss. Has left eye vision loss. Skin: no rash, no skin tear. MSK: No muscle spasm, no deformity, no limitation of range of movement in spin Heme: No easy bruising.  Travel history: No recent long distant travel.   Allergy: No Known Allergies  Past Medical History:  Diagnosis Date   CAFL (chronic airflow limitation) (HCC) 07/10/2014   COPD (chronic obstructive pulmonary disease) (HCC)    Hernia of abdominal wall    Hyperlipidemia    Hypothyroid    Insomnia     Past Surgical History:  Procedure Laterality Date   APPENDECTOMY     BACK SURGERY     neck     CERVICAL DISC SURGERY  2008   Ruptured disc   COLONOSCOPY N/A 08/27/2020   Procedure: COLONOSCOPY;  Surgeon: Wyline Mood, MD;  Location: [facility] ENDOSCOPY;  Service: Gastroenterology;  Laterality: N/A;   COLONOSCOPY WITH PROPOFOL N/A 12/11/2019   Procedure: COLONOSCOPY WITH PROPOFOL;  Surgeon: Wyline Mood, MD;  Location: [facility] ENDOSCOPY;  Service: Gastroenterology;  Laterality: N/A;   COLONOSCOPY WITH PROPOFOL  N/A 04/15/2021   Procedure: COLONOSCOPY WITH PROPOFOL;  Surgeon: Midge Minium, MD;  Location: [facility] ENDOSCOPY;  Service: Endoscopy;  Laterality: N/A;   HERNIA REPAIR     INGUINAL HERNIA REPAIR Right    SHOULDER SURGERY     XI ROBOTIC ASSISTED INGUINAL HERNIA REPAIR WITH MESH Left 11/10/2019   Procedure: XI ROBOTIC ASSISTED INGUINAL HERNIA REPAIR WITH MESH;  Surgeon: Campbell Lerner, MD;  Location:  [facility] ORS;  Service: General;  Laterality: Left;    Social History:  reports that he has been smoking cigarettes. He has a 53 pack-year smoking history. He has never used smokeless tobacco. He reports current alcohol use. He reports that he does not use drugs.  Family History:  Family History  Problem Relation Age of Onset   Cancer Mother        colon ca   Hypertension Father    Cancer Father      Prior to Admission medications   Medication Sig Start Date End Date Taking? Authorizing Provider  clobetasol ointment (TEMOVATE) 0.05 % Apply topically. 12/09/21   [provider]  Fluticasone Furoate (ARNUITY ELLIPTA) 100 MCG/ACT AEPB Inhale 100 mcg into the lungs daily. 02/16/22   Simmons-Robinson, Tawanna Cooler, MD  levothyroxine (SYNTHROID) 150 MCG tablet TAKE 1 TABLET DAILY 11/18/21   Malva Limes, MD  meloxicam (MOBIC) 15 MG tablet TAKE 1 TABLET (15 MG TOTAL) BY MOUTH DAILY. 10/28/20   Bosie Clos, MD  pravastatin (PRAVACHOL) 40 MG tablet Take 1 tablet (40 mg total) by mouth daily. 02/10/22   Simmons-Robinson, Tawanna Cooler, MD  traZODone (DESYREL) 100 MG tablet TAKE 1 TABLET AT BEDTIME 11/18/21   Malva Limes, MD  Vibegron (GEMTESA) 75 MG TABS Take 1 tablet (75 mg total) by mouth daily. 12/28/22   Riki Altes, MD    Physical Exam: Vitals:   01/04/23 2152 01/04/23 2230 01/04/23 2300 01/05/23 0000  BP: (!) 212/73 (!) 132/90 (!) 145/84 (!) 161/85  Pulse: (!) 52 69 (!) 50 (!) 50  Resp: 18 16 17 15   Temp: 97.8 F (36.6 C)     TempSrc: Oral     SpO2: 98%      General: Not in acute distress HEENT:       Eyes: PERRL, EOMI, no jaundice       ENT: No discharge from the ears and nose, no pharynx injection, no tonsillar enlargement.        Neck: No JVD, no bruit, no mass felt. Heme: No neck lymph node enlargement. Cardiac: S1/S2, RRR, No murmurs, No gallops or rubs. Respiratory: No rales, wheezing, rhonchi or rubs. GI: Soft, nondistended, nontender, no rebound pain, no  organomegaly, BS present. GU: No hematuria Ext: No pitting leg edema bilaterally. 1+DP/PT pulse bilaterally. Musculoskeletal: No joint deformities, No joint redness or warmth, no limitation of ROM in spin. Skin: No rashes.  Neuro: Alert, oriented X3, cranial nerves II-XII grossly intact, moves all extremities normally. Muscle strength 5/5 in all extremities, sensation to light touch intact.  Psych: Patient is not psychotic, no suicidal or hemocidal ideation.  Labs on Admission: I have personally reviewed following labs and imaging studies  CBC: Recent Labs  Lab 01/04/23 2225  WBC 7.8  NEUTROABS 4.5  HGB 14.9  HCT 44.1  MCV 98.2  PLT 101*   Basic Metabolic Panel: Recent Labs  Lab 01/04/23 2225  NA 139  K 4.0  CL 103  CO2 27  GLUCOSE 123*  BUN 26*  CREATININE 1.17  CALCIUM 8.7*  GFR: Estimated Creatinine Clearance: 46.2 mL/min (by C-G formula based on SCr of 1.17 mg/dL). Liver Function Tests: Recent Labs  Lab 01/04/23 2225  AST 18  ALT 16  ALKPHOS 73  BILITOT 0.5  PROT 6.5  ALBUMIN 3.7   No results for input(s): "LIPASE", "AMYLASE" in the last 168 hours. No results for input(s): "AMMONIA" in the last 168 hours. Coagulation Profile: Recent Labs  Lab 01/04/23 2225  INR 1.0   Cardiac Enzymes: No results for input(s): "CKTOTAL", "CKMB", "CKMBINDEX", "TROPONINI" in the last 168 hours. BNP (last 3 results) No results for input(s): "PROBNP" in the last 8760 hours. HbA1C: No results for input(s): "HGBA1C" in the last 72 hours. CBG: Recent Labs  Lab 01/04/23 2243  GLUCAP 124*   Lipid Profile: No results for input(s): "CHOL", "HDL", "LDLCALC", "TRIG", "CHOLHDL", "LDLDIRECT" in the last 72 hours. Thyroid Function Tests: No results for input(s): "TSH", "T4TOTAL", "FREET4", "T3FREE", "THYROIDAB" in the last 72 hours. Anemia Panel: No results for input(s): "VITAMINB12", "FOLATE", "FERRITIN", "TIBC", "IRON", "RETICCTPCT" in the last 72 hours. Urine analysis:     Component Value Date/Time   COLORURINE Yellow 09/30/2013 0155   APPEARANCEUR Clear 12/28/2022 1108   LABSPEC 1.008 09/30/2013 0155   PHURINE 7.0 09/30/2013 0155   GLUCOSEU Negative 12/28/2022 1108   GLUCOSEU Negative 09/30/2013 0155   HGBUR Negative 09/30/2013 0155   BILIRUBINUR Negative 12/28/2022 1108   BILIRUBINUR Negative 09/30/2013 0155   KETONESUR Negative 09/30/2013 0155   PROTEINUR 2+ (A) 12/28/2022 1108   PROTEINUR Negative 09/30/2013 0155   UROBILINOGEN negative 03/26/2015 1451   NITRITE Negative 12/28/2022 1108   NITRITE Negative 09/30/2013 0155   LEUKOCYTESUR Negative 12/28/2022 1108   LEUKOCYTESUR Negative 09/30/2013 0155   Sepsis Labs: @LABRCNTIP (procalcitonin:4,lacticidven:4) ) Recent Results (from the past 240 hour(s))  Microscopic Examination     Status: Abnormal   Collection Time: 12/28/22 11:08 AM   Urine  Result Value Ref Range Status   WBC, UA 0-5 0 - 5 /hpf Final   RBC, Urine 3-10 (A) 0 - 2 /hpf Final   Epithelial Cells (non renal) 0-10 0 - 10 /hpf Final   Bacteria, UA None seen None seen/Few Final     Radiological Exams on Admission: CT HEAD WO CONTRAST  Result Date: 01/05/2023 CLINICAL DATA:  Transient ischemic attack EXAM: CT HEAD WITHOUT CONTRAST CT ANGIOGRAPHY OF THE HEAD AND NECK TECHNIQUE: Contiguous axial images were obtained from the base of the skull through the vertex without intravenous contrast. Multidetector CT imaging of the head and neck was performed using the standard protocol during bolus administration of intravenous contrast. Multiplanar CT image reconstructions and MIPs were obtained to evaluate the vascular anatomy. Carotid stenosis measurements (when applicable) are obtained utilizing NASCET criteria, using the distal internal carotid diameter as the denominator. RADIATION DOSE REDUCTION: This exam was performed according to the departmental dose-optimization program which includes automated exposure control, adjustment of the mA  and/or kV according to patient size and/or use of iterative reconstruction technique. CONTRAST:  75mL OMNIPAQUE IOHEXOL 350 MG/ML SOLN COMPARISON:  09/30/2013 FINDINGS: CT HEAD FINDINGS Brain: There is no mass, hemorrhage or extra-axial collection. Normal appearance of the white matter with preserved gray-white differentiation. Normal CSF spaces. Skull: The visualized skull base, calvarium and extracranial soft tissues are normal. Sinuses/Orbits: No fluid levels or advanced mucosal thickening of the visualized paranasal sinuses. No mastoid or middle ear effusion. Normal orbits. CTA NECK FINDINGS SKELETON: Multilevel degenerative disc disease and facet hypertrophy without bony spinal canal stenosis. OTHER NECK:  Normal pharynx, larynx and major salivary glands. No cervical lymphadenopathy. Unremarkable thyroid gland. UPPER CHEST: Biapical emphysema AORTIC ARCH: There is calcific atherosclerosis of the aortic arch. Conventional 3 vessel aortic branching pattern. RIGHT CAROTID SYSTEM: No dissection, occlusion or aneurysm. Mild atherosclerotic calcification at the carotid bifurcation without hemodynamically significant stenosis. LEFT CAROTID SYSTEM: No dissection, occlusion or aneurysm. There is calcific atherosclerosis extending into the proximal ICA, resulting in less than 50% stenosis. VERTEBRAL ARTERIES: Codominant configuration. 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 There is multifocal mild narrowing of the left V2 segment that appears to be due to extrinsic compression by facet osteophytes. The right vertebral artery is normal to the skull base. CTA HEAD FINDINGS POSTERIOR CIRCULATION: --Vertebral arteries: Normal V4 segments. --Inferior cerebellar arteries: Normal. --Basilar artery: Normal. --Superior cerebellar arteries: Normal. --Posterior cerebral arteries (PCA): Normal. ANTERIOR CIRCULATION: --Intracranial internal carotid arteries: Normal. --Anterior cerebral  arteries (ACA): Normal. --Middle cerebral arteries (MCA): Normal. VENOUS SINUSES: As permitted by contrast timing, patent. ANATOMIC VARIANTS: None Review of the MIP images confirms the above findings. IMPRESSION: 1. No emergent large vessel occlusion or hemodynamically significant stenosis of the head or neck. 2. 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 Aortic Atherosclerosis (ICD10-I70.0) and Emphysema (ICD10-J43.9). Electronically Signed   By: Deatra Robinson M.D.   On: 01/05/2023 00:07   CT ANGIO HEAD NECK W WO CM  Result Date: 01/05/2023 CLINICAL DATA:  Transient ischemic attack EXAM: CT HEAD WITHOUT CONTRAST CT ANGIOGRAPHY OF THE HEAD AND NECK TECHNIQUE: Contiguous axial images were obtained from the base of the skull through the vertex without intravenous contrast. Multidetector CT imaging of the head and neck was performed using the standard protocol during bolus administration of intravenous contrast. Multiplanar CT image reconstructions and MIPs were obtained to evaluate the vascular anatomy. Carotid stenosis measurements (when applicable) are obtained utilizing NASCET criteria, using the distal internal carotid diameter as the denominator. RADIATION DOSE REDUCTION: This exam was performed according to the departmental dose-optimization program which includes automated exposure control, adjustment of the mA and/or kV according to patient size and/or use of iterative reconstruction technique. CONTRAST:  75mL OMNIPAQUE IOHEXOL 350 MG/ML SOLN COMPARISON:  09/30/2013 FINDINGS: CT HEAD FINDINGS Brain: There is no mass, hemorrhage or extra-axial collection. Normal appearance of the white matter with preserved gray-white differentiation. Normal CSF spaces. Skull: The visualized skull base, calvarium and extracranial soft tissues are normal. Sinuses/Orbits: No fluid levels or advanced mucosal thickening of the visualized paranasal sinuses. No mastoid or middle ear  effusion. Normal orbits. CTA NECK FINDINGS SKELETON: Multilevel degenerative disc disease and facet hypertrophy without bony spinal canal stenosis. OTHER NECK: Normal pharynx, larynx and major salivary glands. No cervical lymphadenopathy. Unremarkable thyroid gland. UPPER CHEST: Biapical emphysema AORTIC ARCH: There is calcific atherosclerosis of the aortic arch. Conventional 3 vessel aortic branching pattern. RIGHT CAROTID SYSTEM: No dissection, occlusion or aneurysm. Mild atherosclerotic calcification at the carotid bifurcation without hemodynamically significant stenosis. LEFT CAROTID SYSTEM: No dissection, occlusion or aneurysm. There is calcific atherosclerosis extending into the proximal ICA, resulting in less than 50% stenosis. VERTEBRAL ARTERIES: Codominant configuration. 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 There is multifocal mild narrowing of the left V2 segment that appears to be due to extrinsic compression by facet osteophytes. The right vertebral artery is normal to the skull base. CTA HEAD FINDINGS POSTERIOR CIRCULATION: --Vertebral arteries: Normal V4 segments. --Inferior cerebellar arteries: Normal. --Basilar artery: Normal. --Superior cerebellar arteries: Normal. --Posterior  cerebral arteries (PCA): Normal. ANTERIOR CIRCULATION: --Intracranial internal carotid arteries: Normal. --Anterior cerebral arteries (ACA): Normal. --Middle cerebral arteries (MCA): Normal. VENOUS SINUSES: As permitted by contrast timing, patent. ANATOMIC VARIANTS: None Review of the MIP images confirms the above findings. IMPRESSION: 1. No emergent large vessel occlusion or hemodynamically significant stenosis of the head or neck. 2. 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 Aortic Atherosclerosis (ICD10-I70.0) and Emphysema (ICD10-J43.9). Electronically Signed   By: Deatra Robinson M.D.   On: 01/05/2023 00:07   CT HEMATURIA  WORKUP  Result Date: 01/04/2023 CLINICAL DATA:  Microhematuria.  BPH. EXAM: CT ABDOMEN AND PELVIS WITHOUT AND WITH CONTRAST TECHNIQUE: Multidetector CT imaging of the abdomen and pelvis was performed following the standard protocol before and following the bolus administration of intravenous contrast. Initial unenhanced images are obtained. Subsequent contrast-enhanced images are obtained with portal venous phase imaging in the supine position and delayed imaging in the prone position. RADIATION DOSE REDUCTION: This exam was performed according to the departmental dose-optimization program which includes automated exposure control, adjustment of the mA and/or kV according to patient size and/or use of iterative reconstruction technique. CONTRAST:  OMNIPAQUE IOHEXOL 300 MG/ML  SOLN COMPARISON:  PET-CT 06/13/2020 FINDINGS: Lower chest: Lung bases are clear. Hepatobiliary: No focal liver abnormality is seen. No gallstones, gallbladder wall thickening, or biliary dilatation. Pancreas: Unremarkable. No pancreatic ductal dilatation or surrounding inflammatory changes. Spleen: Normal in size without focal abnormality. Adrenals/Urinary Tract: No adrenal gland nodules. Unenhanced images demonstrate renal vascular calcifications. No stones. Bilateral renal cysts. Largest on the left measures about 4 cm diameter. No imaging follow-up is indicated. No hydronephrosis or hydroureter. Intrarenal collecting systems, ureters, and the bladder are unremarkable. No urinary tract mass or filling defect identified. Stomach/Bowel: Stomach, small bowel, and colon are not abnormally distended. No wall thickening or inflammatory changes. Appendix is not identified. Vascular/Lymphatic: Diffuse calcification of the abdominal aorta and iliac arteries. Bilateral common iliac artery aneurysms with right measuring 1.5 cm diameter and left 1.8 cm diameter. No significant lymphadenopathy. Reproductive: Prostate gland is enlarged, measuring 5  cm diameter. Other: No free air or free fluid in the abdomen. Postoperative right inguinal hernia repair. Musculoskeletal: Spondylolysis with mild spondylolisthesis at L5-S1. Mild degenerative changes in the lumbar spine. IMPRESSION: 1. No renal or ureteral stone or obstruction. No solid mass in the urinary tract. No abnormalities identified to account for hematuria. 2. 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 3. Prostate gland is enlarged. Electronically Signed   By: Burman Nieves M.D.   On: 01/04/2023 23:11      Assessment/Plan Principal Problem:   Vision loss of left eye Active Problems:   HLD (hyperlipidemia)   COPD (chronic obstructive pulmonary disease) (HCC)   Thrombocytopenia (HCC)   Sinus bradycardia   Acquired hypothyroidism   Insomnia   BPH (benign prostatic hyperplasia)   White coat syndrome without diagnosis of hypertension   Tobacco abuse disorder   Assessment and Plan:   Vision loss of left eye: CT head negative.  CTA of head and neck negative for LVO.  Potential differential diagnosis include stroke and retinal artery or vein occlusion (CRAO, BRAO,, BRVO, CRVO).  Retinal detachment is less likely since patient's vision loss  has quickly resolved.  -Placed on tele for observation - Obtain MRI-brain  - MRI orbits with and without contrast. - ASA - Statin:  switch pravastatin to Lipitor 80 mg daily - fasting lipid panel and HbA1c  - 2D transthoracic echocardiography  -  swallowing screen. If fails, will get SLP -Patient does not have weakness in extremities, no need of PT/OT  HLD (hyperlipidemia) -Lipitor  COPD (chronic obstructive pulmonary disease) (HCC): Stable -Bronchodilators empiric Mucinex  Thrombocytopenia (HCC): Platelets are 101, this is chronic issue. -Follow-up with CBC  Sinus bradycardia: Heart rate 48-50s.  He is asymptomatic for this -Avoid using nodal blockers  Acquired hypothyroidism -Synthroid  Insomnia -As needed  Ambien  BPH (benign prostatic hyperplasia) -Myrbetriq  White coat syndrome without diagnosis of hypertension: Currently blood pressure 145/78 -Will not treat his blood pressure  Tobacco abuse disorder -Did counseling about importance of quitting smoking -Nicotine patch     DVT ppx: SCD  Code Status: Full code     Family Communication:   Yes, patient's daughter and the wife at bed side.    Disposition Plan:  Anticipate discharge back to previous environment  Consults called: None  Admission status and Level of care: Telemetry Medical:     for obs    Dispo: The patient is from: Home              Anticipated d/c is to: Home              Anticipated d/c date is: 2 days              Patient currently is not medically stable to d/c.    Severity of Illness:  The appropriate patient status for this patient is OBSERVATION. Observation status is judged to be reasonable and necessary in order to provide the required intensity of service to ensure the patient's safety. The patient's presenting symptoms, physical exam findings, and initial radiographic and laboratory data in the context of their medical condition is felt to place them at decreased risk for further clinical deterioration. Furthermore, it is anticipated that the patient will be medically stable for discharge from the hospital within 2 midnights of admission.        Date of Service 01/05/2023    Lorretta Harp [facility]   If 7PM-7AM, please contact night-coverage [email or web address] 01/05/2023, 1:44 AM

## 2023-01-04 NOTE — ED Triage Notes (Addendum)
Pt to ED via POV c/o loss of vision. Pt reports he was sitting down watching TV when suddenly got lightheaded and lost vision in left eye. Pt regained vision in that eye in about a minute. Denies headache. Pt A&Ox4 able to move all extremities, no facial droop. No code stroke at this time per [facility] MD

## 2023-01-04 NOTE — ED Provider Notes (Signed)
[facility] Provider Note    Event Date/Time   First MD Initiated Contact with Patient 01/04/23 2159     (approximate)   History   Loss of Vision   HPI  Hunter Oconnor is a 72 y.o. male with history of hyperlipidemia, hypothyroidism, COPD, and BPH who presents with acute onset of left-sided vision loss this evening while he was watching TV, lasting about a minute, now resolved.  The patient states that he stood up, got somewhat lightheaded, and then noted that his vision had turned completely gray on that side.  He had a small area of intact vision around the bottom of his visual field on that side.  He is confident that the vision loss was just in the left eye as opposed to the left visual field of both eyes.  He denies any associated headache, difficulty with speech or coordination, weakness or numbness, or other acute symptoms.  He denies any prior history of similar episodes.  I reviewed the past medical records per the patient's most recent outpatient encounter was with family medicine on 10/10 for follow-up of urinary symptoms and leg cramps.  He has no recent ED visits or hospitalizations.   Physical Exam   Triage Vital Signs: ED Triage Vitals  Encounter Vitals Group     BP 01/04/23 2152 (!) 212/73     Systolic BP Percentile --      Diastolic BP Percentile --      Pulse Rate 01/04/23 2152 (!) 52     Resp 01/04/23 2152 18     Temp 01/04/23 2152 97.8 F (36.6 C)     Temp Source 01/04/23 2152 Oral     SpO2 01/04/23 2152 98 %     Weight --      Height --      Head Circumference --      Peak Flow --      Pain Score 01/04/23 2146 0     Pain Loc --      Pain Education --      Exclude from Growth Chart --     Most recent vital signs: Vitals:   01/04/23 2152  BP: (!) 212/73  Pulse: (!) 52  Resp: 18  Temp: 97.8 F (36.6 C)  SpO2: 98%     General: Alert and oriented, well-appearing, no distress.  CV:  Good peripheral perfusion.   Resp:  Normal effort.  Abd:  No distention.  Other:  EOMI.  PERRLA.  No photophobia.  Visual fields intact.  Normal speech.  No facial droop.  5/5 motor strength and intact sensation all extremities.  No pronator drift.  No ataxia on finger-to-nose.   ED Results / Procedures / Treatments   Labs (all labs ordered are listed, but only abnormal results are displayed) Labs Reviewed  CBC - Abnormal; Notable for the following components:      Result Value   Platelets 101 (*)    All other components within normal limits  DIFFERENTIAL - Abnormal; Notable for the following components:   Eosinophils Absolute 0.7 (*)    All other components within normal limits  COMPREHENSIVE METABOLIC PANEL - Abnormal; Notable for the following components:   Glucose, Bld 123 (*)    BUN 26 (*)    Calcium 8.7 (*)    All other components within normal limits  CBG MONITORING, ED - Abnormal; Notable for the following components:   Glucose-Capillary 124 (*)    All other components within  normal limits  PROTIME-INR  APTT  ETHANOL     EKG  ED ECG REPORT I, Dionne Bucy, the attending physician, personally viewed and interpreted this ECG.  Date: 01/04/2023 EKG Time: 2153 Rate: 52 Rhythm: normal sinus rhythm QRS Axis: normal Intervals: Incomplete RBBB, LAFB ST/T Wave abnormalities: normal Narrative Interpretation: no evidence of acute ischemia    RADIOLOGY  CT head: I independently viewed and interpreted the images; there is no ICH, mass effect, or midline shift   PROCEDURES:  Critical Care performed: No  Procedures   MEDICATIONS ORDERED IN ED: Medications  sodium chloride flush (NS) 0.9 % injection 3 mL (3 mLs Intravenous Given 01/04/23 2226)  iohexol (OMNIPAQUE) 350 MG/ML injection 75 mL (75 mLs Intravenous Contrast Given 01/04/23 2305)     IMPRESSION / MDM / ASSESSMENT AND PLAN / ED COURSE  I reviewed the triage vital signs and the nursing notes.  72 year old male with PMH as  noted above presents with acute onset of left eye vision loss that lasted about 1 minute and has now resolved.  He reports feeling lightheaded but denies any other focal neurologic symptoms.  Thorough neurologic exam is normal.  The patient was hypertensive on arrival and has a history of whitecoat hypertension; blood pressure is now normalized.  Differential diagnosis includes, but is not limited to, amaurosis fugax, TIA, CRAO, CRVO, migraine.  I have a low suspicion for retinal detachment or vitreous hemorrhage given the almost immediate resolution.  Given that the patient's symptoms have resolved there is no indication for code stroke activation.  We will obtain CT head and lab workup.  If initial CT is negative we will obtain an MRI, CTA, and plan to admit for TIA workup.  Patient's presentation is most consistent with acute presentation with potential threat to life or bodily function.  The patient is on the cardiac monitor to evaluate for evidence of arrhythmia and/or significant heart rate changes.  ----------------------------------------- 11:20 PM on 01/04/2023 -----------------------------------------  Lab workup is unremarkable.  CBC shows no leukocytosis or anemia.  CMP shows no significant electrolyte abnormalities.  ----------------------------------------- 11:41 PM on 01/04/2023 -----------------------------------------  CT radiology read is still pending.  I have reviewed the imaging and there is no evidence of acute hemorrhage, mass effect, or midline shift.  I ordered CTA and MRI.  I consulted and Dr. Clyde Lundborg from the hospitalist service; based on our discussion he agrees to evaluate the patient for admission.   FINAL CLINICAL IMPRESSION(S) / ED DIAGNOSES   Final diagnoses:  Amaurosis fugax     Rx / DC Orders   ED Discharge Orders     None        Note:  This document was prepared using Dragon voice recognition software and may include unintentional dictation  errors.    Dionne Bucy, MD 01/04/23 2342

## 2023-01-04 NOTE — ED Notes (Signed)
Patient transported to CT prior to obtaining bloodwork

## 2023-01-05 ENCOUNTER — Observation Stay (HOSPITAL_BASED_OUTPATIENT_CLINIC_OR_DEPARTMENT_OTHER)
Admit: 2023-01-05 | Discharge: 2023-01-05 | Disposition: A | Discharge status: Home / Self Care | Payer: Medicare Other | Attending: Internal Medicine | Admitting: Internal Medicine

## 2023-01-05 ENCOUNTER — Observation Stay: Payer: Medicare Other

## 2023-01-05 DIAGNOSIS — I6389 Other cerebral infarction: Secondary | ICD-10-CM | POA: Diagnosis not present

## 2023-01-05 DIAGNOSIS — H5462 Unqualified visual loss, left eye, normal vision right eye: Secondary | ICD-10-CM | POA: Diagnosis not present

## 2023-01-05 DIAGNOSIS — G4700 Insomnia, unspecified: Secondary | ICD-10-CM | POA: Diagnosis present

## 2023-01-05 DIAGNOSIS — R9089 Other abnormal findings on diagnostic imaging of central nervous system: Secondary | ICD-10-CM | POA: Diagnosis not present

## 2023-01-05 DIAGNOSIS — I351 Nonrheumatic aortic (valve) insufficiency: Secondary | ICD-10-CM

## 2023-01-05 DIAGNOSIS — D696 Thrombocytopenia, unspecified: Secondary | ICD-10-CM | POA: Diagnosis present

## 2023-01-05 DIAGNOSIS — N400 Enlarged prostate without lower urinary tract symptoms: Secondary | ICD-10-CM | POA: Diagnosis present

## 2023-01-05 DIAGNOSIS — H547 Unspecified visual loss: Secondary | ICD-10-CM | POA: Diagnosis not present

## 2023-01-05 DIAGNOSIS — I340 Nonrheumatic mitral (valve) insufficiency: Secondary | ICD-10-CM

## 2023-01-05 DIAGNOSIS — G453 Amaurosis fugax: Secondary | ICD-10-CM | POA: Diagnosis not present

## 2023-01-05 DIAGNOSIS — I6782 Cerebral ischemia: Secondary | ICD-10-CM | POA: Diagnosis not present

## 2023-01-05 DIAGNOSIS — R001 Bradycardia, unspecified: Secondary | ICD-10-CM | POA: Diagnosis present

## 2023-01-05 LAB — ECHOCARDIOGRAM COMPLETE
AR max vel: 2.01 cm2
AV Area VTI: 2.49 cm2
AV Area mean vel: 2.27 cm2
AV Mean grad: 4 mm[Hg]
AV Peak grad: 9.7 mm[Hg]
Ao pk vel: 1.56 m/s
Area-P 1/2: 2.48 cm2
MV VTI: 2.54 cm2
P 1/2 time: 977 ms
S' Lateral: 3.2 cm

## 2023-01-05 LAB — LIPID PANEL
Cholesterol: 130 mg/dL (ref 0–200)
HDL: 46 mg/dL (ref >40)
LDL Cholesterol: 55 mg/dL (ref 0–99)
Total CHOL/HDL Ratio: 2.8 {ratio}
Triglycerides: 144 mg/dL (ref <150)
VLDL: 29 mg/dL (ref 0–40)

## 2023-01-05 LAB — HEMOGLOBIN A1C
Hgb A1c MFr Bld: 5.8 % — ABNORMAL HIGH (ref 4.8–5.6)
Mean Plasma Glucose: 119.76 mg/dL

## 2023-01-05 MED ORDER — ACETAMINOPHEN 160 MG/5ML PO SOLN: 650.0000 mg | ORAL | Status: DC | PRN

## 2023-01-05 MED ORDER — MIRABEGRON ER 25 MG PO TB24
25.0000 mg | ORAL_TABLET | Freq: Every day | ORAL | Status: DC
  Filled 2023-01-05: qty 1

## 2023-01-05 MED ORDER — TRAZODONE HCL 100 MG PO TABS: 100.0000 mg | ORAL_TABLET | Freq: Every day | ORAL | Status: DC

## 2023-01-05 MED ORDER — BUDESONIDE 0.25 MG/2ML IN SUSP
0.2500 mg | Freq: Two times a day (BID) | RESPIRATORY_TRACT | Status: DC
  Administered 2023-01-05: 0.25 mg via RESPIRATORY_TRACT
  Filled 2023-01-05: qty 2

## 2023-01-05 MED ORDER — ACETAMINOPHEN 650 MG RE SUPP: 650.0000 mg | RECTAL | Status: DC | PRN

## 2023-01-05 MED ORDER — GADOBUTROL 1 MMOL/ML IV SOLN
6.0000 mL | Freq: Once | INTRAVENOUS | Status: AC | PRN
  Administered 2023-01-05: 6 mL via INTRAVENOUS

## 2023-01-05 MED ORDER — ACETAMINOPHEN 325 MG PO TABS: 650.0000 mg | ORAL_TABLET | ORAL | Status: DC | PRN

## 2023-01-05 MED ORDER — LEVOTHYROXINE SODIUM 50 MCG PO TABS
150.0000 ug | ORAL_TABLET | Freq: Every day | ORAL | Status: DC
  Filled 2023-01-05: qty 1

## 2023-01-05 MED ORDER — FLUTICASONE FUROATE 100 MCG/ACT IN AEPB: 100.0000 ug | INHALATION_SPRAY | Freq: Every day | RESPIRATORY_TRACT | Status: DC

## 2023-01-05 MED ORDER — SENNOSIDES-DOCUSATE SODIUM 8.6-50 MG PO TABS: 1.0000 | ORAL_TABLET | Freq: Every evening | ORAL | Status: DC | PRN

## 2023-01-05 MED ORDER — ASPIRIN 325 MG PO TABS
325.0000 mg | ORAL_TABLET | Freq: Every day | ORAL | Status: DC
  Filled 2023-01-05: qty 1

## 2023-01-05 MED ORDER — ZOLPIDEM TARTRATE 5 MG PO TABS: 5.0000 mg | ORAL_TABLET | Freq: Every evening | ORAL | Status: DC | PRN

## 2023-01-05 MED ORDER — ASPIRIN 300 MG RE SUPP
300.0000 mg | Freq: Every day | RECTAL | Status: DC
  Filled 2023-01-05: qty 1

## 2023-01-05 MED ORDER — STROKE: EARLY STAGES OF RECOVERY BOOK: Freq: Once | Status: DC

## 2023-01-05 MED ORDER — ENOXAPARIN SODIUM 40 MG/0.4ML IJ SOSY: 40.0000 mg | PREFILLED_SYRINGE | INTRAMUSCULAR | Status: DC

## 2023-01-05 NOTE — Discharge Summary (Signed)
Physician Discharge Summary  ITZHAK BARTNIK BJY:[id number] DOB: 05-Jun-1950 DOA: 01/04/2023  PCP: Bosie Clos, MD  Admit date: 01/04/2023 Discharge date: 01/05/2023  Admitted From: Home Disposition:  Home  Recommendations for Outpatient Follow-up:  Follow up with PCP in 1-2 weeks   Home Health:No  Equipment/Devices:None   Discharge Condition:Stable  CODE STATUS:FULL  Diet recommendation: Reg  Brief/Interim Summary: 72 y.o. 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   Patient states that he had sudden left eye vision loss at about 9:45 when he was watching TV at home.  It lasted for about 1 minutes, then completely resolved. The patient states that he stood up, got somewhat lightheaded, and then noted that his left eye vision had turned completely gray on that side. No eye pain.  No headache.  Denies unilateral numbness or tingling in extremities.  No facial droop or slurred speech. His right eye is normal.  TIA CVA workup is essentially normal.  Echocardiogram was completed before discharge.  Results are pending.    Discharge Diagnoses:  Principal Problem:   Vision loss of left eye Active Problems:   HLD (hyperlipidemia)   COPD (chronic obstructive pulmonary disease) (HCC)   Thrombocytopenia (HCC)   Sinus bradycardia   Acquired hypothyroidism   Insomnia   BPH (benign prostatic hyperplasia)   White coat syndrome without diagnosis of hypertension   Tobacco abuse disorder  Transient unilateral vision loss Etiology unclear.  CVA workup negative.  Could be related to transient orthostasis.  Low suspicion for stroke or retinal artery or vein occlusion.  Vision recovered spontaneously within the course of 1 minute.  MRI imaging survey negative.  Okay to resume home aspirin.  Lipid  panel reassuring.  No indication for high intensity statin.  HbA1c 5.8, nondiabetic range.  2D echocardiogram completed but pending at time of discharge.  Low suspicion for any acute issues on echocardiogram requires continued hospitalization.  Patient stable for discharge home at this time.  Follow-up outpatient PCP.  Discharge Instructions  Discharge Instructions     Diet - low sodium heart healthy   Complete by: As directed    Increase activity slowly   Complete by: As directed       Allergies as of 01/05/2023   No Known Allergies      Medication List     TAKE these medications    Arnuity Ellipta 100 MCG/ACT Aepb Generic drug: Fluticasone Furoate Inhale 100 mcg into the lungs daily. What changed: when to take this   aspirin EC 81 MG tablet Take 81 mg by mouth at bedtime.   Gemtesa 75 MG Tabs Generic drug: Vibegron Take 1 tablet (75 mg total) by mouth daily. What changed: when to take this   levothyroxine 150 MCG tablet Commonly known as: SYNTHROID TAKE 1 TABLET DAILY What changed: when to take this   meloxicam 15 MG tablet Commonly known as: MOBIC TAKE 1 TABLET (15 MG TOTAL) BY MOUTH DAILY. What changed: when to take this   pravastatin 40 MG tablet Commonly known as: PRAVACHOL Take 1 tablet (40 mg total) by mouth daily. What changed: when to take this   traZODone 100 MG tablet Commonly known as: DESYREL TAKE 1 TABLET AT BEDTIME        No Known Allergies  Consultations: None   Procedures/Studies: MR BRAIN WO CONTRAST  Result Date: 01/05/2023 CLINICAL DATA:  Monocular vision loss, left EXAM: MRI HEAD AND ORBITS WITHOUT AND WITH CONTRAST TECHNIQUE: Multiplanar, multiecho pulse sequences of the brain and surrounding structures were obtained without and with intravenous contrast. Multiplanar, multiecho pulse sequences of the orbits and surrounding structures were obtained including fat saturation techniques, before and after intravenous contrast  administration. CONTRAST:  6mL GADAVIST GADOBUTROL 1 MMOL/ML IV SOLN COMPARISON:  None Available. FINDINGS: MRI HEAD FINDINGS Brain: No acute infarct, mass effect or extra-axial collection. No chronic microhemorrhage or siderosis. There is multifocal hyperintense T2-weighted signal within the white matter. Generalized volume loss. The midline structures are normal. Vascular: Normal flow voids. Skull and upper cervical spine: Normal marrow signal. Other: None. MRI ORBITS FINDINGS Orbits: No traumatic or inflammatory finding. Globes, optic nerves, orbital fat, extraocular muscles, vascular structures, and lacrimal glands are normal. Visualized sinuses: Clear. Soft tissues: Negative. IMPRESSION: 1. No acute intracranial abnormality. 2. Findings of chronic small vessel ischemia and volume loss. 3. Normal MRI of the orbits. Electronically Signed   By: Deatra Robinson M.D.   On: 01/05/2023 02:51   MR ORBITS W WO CONTRAST  Result Date: 01/05/2023 CLINICAL DATA:  Monocular vision loss, left EXAM: MRI HEAD AND ORBITS WITHOUT AND WITH CONTRAST TECHNIQUE: Multiplanar, multiecho pulse sequences of the brain and surrounding structures were obtained without and with intravenous contrast. Multiplanar, multiecho pulse sequences of the orbits and surrounding structures were obtained including fat saturation techniques, before and after intravenous contrast administration. CONTRAST:  6mL GADAVIST GADOBUTROL 1 MMOL/ML IV SOLN COMPARISON:  None Available. FINDINGS: MRI HEAD FINDINGS Brain: No acute infarct, mass effect or extra-axial collection. No chronic microhemorrhage or siderosis. There is multifocal hyperintense T2-weighted signal within the white matter. Generalized volume loss. The midline structures are normal. Vascular: Normal flow voids. Skull and upper cervical spine: Normal marrow signal. Other: None. MRI ORBITS FINDINGS Orbits: No traumatic or inflammatory finding. Globes, optic nerves, orbital fat, extraocular muscles,  vascular structures, and lacrimal glands are normal. Visualized sinuses: Clear. Soft tissues: Negative. IMPRESSION: 1. No acute intracranial abnormality. 2. Findings of chronic small vessel ischemia and volume loss. 3. Normal MRI of the orbits. Electronically Signed   By: Deatra Robinson M.D.   On: 01/05/2023 02:51   CT HEAD WO CONTRAST  Result Date: 01/05/2023 CLINICAL DATA:  Transient ischemic attack EXAM: CT HEAD WITHOUT CONTRAST CT ANGIOGRAPHY OF THE HEAD AND NECK TECHNIQUE: Contiguous axial images were obtained from the base of the skull through the vertex without intravenous contrast. Multidetector CT imaging of the head and neck was performed using the standard protocol during bolus administration of intravenous contrast. Multiplanar CT image reconstructions and MIPs were obtained to evaluate the vascular anatomy. Carotid stenosis measurements (when applicable) are obtained utilizing NASCET criteria, using the distal internal carotid diameter as the denominator. RADIATION DOSE REDUCTION: This exam was performed according to the departmental dose-optimization program which includes automated exposure control, adjustment of the mA and/or kV according to patient size and/or use of iterative reconstruction technique. CONTRAST:  75mL OMNIPAQUE IOHEXOL 350 MG/ML SOLN COMPARISON:  09/30/2013 FINDINGS: CT HEAD FINDINGS Brain: There is no mass, hemorrhage or extra-axial collection. Normal appearance of the white matter with preserved gray-white differentiation. Normal CSF spaces. Skull: The visualized skull base, calvarium and extracranial soft tissues are normal. Sinuses/Orbits: No fluid levels or advanced mucosal thickening of the visualized paranasal sinuses. No mastoid or middle ear effusion. Normal orbits. CTA NECK FINDINGS SKELETON: Multilevel degenerative disc disease and facet hypertrophy without bony spinal canal stenosis. OTHER NECK: Normal pharynx,  larynx and major salivary glands. No cervical  lymphadenopathy. Unremarkable thyroid gland. UPPER CHEST: Biapical emphysema AORTIC ARCH: There is calcific atherosclerosis of the aortic arch. Conventional 3 vessel aortic branching pattern. RIGHT CAROTID SYSTEM: No dissection, occlusion or aneurysm. Mild atherosclerotic calcification at the carotid bifurcation without hemodynamically significant stenosis. LEFT CAROTID SYSTEM: No dissection, occlusion or aneurysm. There is calcific atherosclerosis extending into the proximal ICA, resulting in less than 50% stenosis. VERTEBRAL ARTERIES: Codominant configuration. 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 There is multifocal mild narrowing of the left V2 segment that appears to be due to extrinsic compression by facet osteophytes. The right vertebral artery is normal to the skull base. CTA HEAD FINDINGS POSTERIOR CIRCULATION: --Vertebral arteries: Normal V4 segments. --Inferior cerebellar arteries: Normal. --Basilar artery: Normal. --Superior cerebellar arteries: Normal. --Posterior cerebral arteries (PCA): Normal. ANTERIOR CIRCULATION: --Intracranial internal carotid arteries: Normal. --Anterior cerebral arteries (ACA): Normal. --Middle cerebral arteries (MCA): Normal. VENOUS SINUSES: As permitted by contrast timing, patent. ANATOMIC VARIANTS: None Review of the MIP images confirms the above findings. IMPRESSION: 1. No emergent large vessel occlusion or hemodynamically significant stenosis of the head or neck. 2. 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 Aortic Atherosclerosis (ICD10-I70.0) and Emphysema (ICD10-J43.9). Electronically Signed   By: Deatra Robinson M.D.   On: 01/05/2023 00:07   CT ANGIO HEAD NECK W WO CM  Result Date: 01/05/2023 CLINICAL DATA:  Transient ischemic attack EXAM: CT HEAD WITHOUT CONTRAST CT ANGIOGRAPHY OF THE HEAD AND NECK TECHNIQUE: Contiguous axial images were obtained from the base of the skull  through the vertex without intravenous contrast. Multidetector CT imaging of the head and neck was performed using the standard protocol during bolus administration of intravenous contrast. Multiplanar CT image reconstructions and MIPs were obtained to evaluate the vascular anatomy. Carotid stenosis measurements (when applicable) are obtained utilizing NASCET criteria, using the distal internal carotid diameter as the denominator. RADIATION DOSE REDUCTION: This exam was performed according to the departmental dose-optimization program which includes automated exposure control, adjustment of the mA and/or kV according to patient size and/or use of iterative reconstruction technique. CONTRAST:  75mL OMNIPAQUE IOHEXOL 350 MG/ML SOLN COMPARISON:  09/30/2013 FINDINGS: CT HEAD FINDINGS Brain: There is no mass, hemorrhage or extra-axial collection. Normal appearance of the white matter with preserved gray-white differentiation. Normal CSF spaces. Skull: The visualized skull base, calvarium and extracranial soft tissues are normal. Sinuses/Orbits: No fluid levels or advanced mucosal thickening of the visualized paranasal sinuses. No mastoid or middle ear effusion. Normal orbits. CTA NECK FINDINGS SKELETON: Multilevel degenerative disc disease and facet hypertrophy without bony spinal canal stenosis. OTHER NECK: Normal pharynx, larynx and major salivary glands. No cervical lymphadenopathy. Unremarkable thyroid gland. UPPER CHEST: Biapical emphysema AORTIC ARCH: There is calcific atherosclerosis of the aortic arch. Conventional 3 vessel aortic branching pattern. RIGHT CAROTID SYSTEM: No dissection, occlusion or aneurysm. Mild atherosclerotic calcification at the carotid bifurcation without hemodynamically significant stenosis. LEFT CAROTID SYSTEM: No dissection, occlusion or aneurysm. There is calcific atherosclerosis extending into the proximal ICA, resulting in less than 50% stenosis. VERTEBRAL ARTERIES: Codominant  configuration. 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 There is multifocal mild narrowing of the left V2 segment that appears to be due to extrinsic compression by facet osteophytes. The right vertebral artery is normal to the skull base. CTA HEAD FINDINGS POSTERIOR CIRCULATION: --Vertebral arteries: Normal V4 segments. --Inferior cerebellar arteries: Normal. --Basilar artery: Normal. --Superior cerebellar arteries: Normal. --Posterior cerebral  arteries (PCA): Normal. ANTERIOR CIRCULATION: --Intracranial internal carotid arteries: Normal. --Anterior cerebral arteries (ACA): Normal. --Middle cerebral arteries (MCA): Normal. VENOUS SINUSES: As permitted by contrast timing, patent. ANATOMIC VARIANTS: None Review of the MIP images confirms the above findings. IMPRESSION: 1. No emergent large vessel occlusion or hemodynamically significant stenosis of the head or neck. 2. 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 Aortic Atherosclerosis (ICD10-I70.0) and Emphysema (ICD10-J43.9). Electronically Signed   By: Deatra Robinson M.D.   On: 01/05/2023 00:07   CT HEMATURIA WORKUP  Result Date: 01/04/2023 CLINICAL DATA:  Microhematuria.  BPH. EXAM: CT ABDOMEN AND PELVIS WITHOUT AND WITH CONTRAST TECHNIQUE: Multidetector CT imaging of the abdomen and pelvis was performed following the standard protocol before and following the bolus administration of intravenous contrast. Initial unenhanced images are obtained. Subsequent contrast-enhanced images are obtained with portal venous phase imaging in the supine position and delayed imaging in the prone position. RADIATION DOSE REDUCTION: This exam was performed according to the departmental dose-optimization program which includes automated exposure control, adjustment of the mA and/or kV according to patient size and/or use of iterative reconstruction technique. CONTRAST:  OMNIPAQUE  IOHEXOL 300 MG/ML  SOLN COMPARISON:  PET-CT 06/13/2020 FINDINGS: Lower chest: Lung bases are clear. Hepatobiliary: No focal liver abnormality is seen. No gallstones, gallbladder wall thickening, or biliary dilatation. Pancreas: Unremarkable. No pancreatic ductal dilatation or surrounding inflammatory changes. Spleen: Normal in size without focal abnormality. Adrenals/Urinary Tract: No adrenal gland nodules. Unenhanced images demonstrate renal vascular calcifications. No stones. Bilateral renal cysts. Largest on the left measures about 4 cm diameter. No imaging follow-up is indicated. No hydronephrosis or hydroureter. Intrarenal collecting systems, ureters, and the bladder are unremarkable. No urinary tract mass or filling defect identified. Stomach/Bowel: Stomach, small bowel, and colon are not abnormally distended. No wall thickening or inflammatory changes. Appendix is not identified. Vascular/Lymphatic: Diffuse calcification of the abdominal aorta and iliac arteries. Bilateral common iliac artery aneurysms with right measuring 1.5 cm diameter and left 1.8 cm diameter. No significant lymphadenopathy. Reproductive: Prostate gland is enlarged, measuring 5 cm diameter. Other: No free air or free fluid in the abdomen. Postoperative right inguinal hernia repair. Musculoskeletal: Spondylolysis with mild spondylolisthesis at L5-S1. Mild degenerative changes in the lumbar spine. IMPRESSION: 1. No renal or ureteral stone or obstruction. No solid mass in the urinary tract. No abnormalities identified to account for hematuria. 2. 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 3. Prostate gland is enlarged. Electronically Signed   By: Burman Nieves M.D.   On: 01/04/2023 23:11      Subjective: Seen and examined on the day of discharge.  Stable no distress.  Appropriate for discharge home.  Discharge Exam: Vitals:   01/05/23 0342 01/05/23 0744  BP: (!) 175/83   Pulse: (!) 45   Resp: 17   Temp: (!)  97.5 F (36.4 C)   SpO2: 97% 96%   Vitals:   01/05/23 0000 01/05/23 0255 01/05/23 0342 01/05/23 0744  BP: (!) 161/85 (!) 117/54 (!) 175/83   Pulse: (!) 50 (!) 38 (!) 45   Resp: 15 14 17    Temp:  98 F (36.7 C) (!) 97.5 F (36.4 C)   TempSrc:   Oral   SpO2:  98% 97% 96%    General: Pt is alert, awake, not in acute distress Cardiovascular: RRR, S1/S2 +, no rubs, no gallops Respiratory: CTA bilaterally, no wheezing, no rhonchi Abdominal: Soft, NT, ND, bowel sounds + Extremities: no edema, no cyanosis  The results of significant diagnostics from this hospitalization (including imaging, microbiology, ancillary and laboratory) are listed below for reference.     Microbiology: Recent Results (from the past 240 hour(s))  Microscopic Examination     Status: Abnormal   Collection Time: 12/28/22 11:08 AM   Urine  Result Value Ref Range Status   WBC, UA 0-5 0 - 5 /hpf Final   RBC, Urine 3-10 (A) 0 - 2 /hpf Final   Epithelial Cells (non renal) 0-10 0 - 10 /hpf Final   Bacteria, UA None seen None seen/Few Final     Labs: BNP (last 3 results) No results for input(s): "BNP" in the last 8760 hours. Basic Metabolic Panel: Recent Labs  Lab 01/04/23 2225  NA 139  K 4.0  CL 103  CO2 27  GLUCOSE 123*  BUN 26*  CREATININE 1.17  CALCIUM 8.7*   Liver Function Tests: Recent Labs  Lab 01/04/23 2225  AST 18  ALT 16  ALKPHOS 73  BILITOT 0.5  PROT 6.5  ALBUMIN 3.7   No results for input(s): "LIPASE", "AMYLASE" in the last 168 hours. No results for input(s): "AMMONIA" in the last 168 hours. CBC: Recent Labs  Lab 01/04/23 2225  WBC 7.8  NEUTROABS 4.5  HGB 14.9  HCT 44.1  MCV 98.2  PLT 101*   Cardiac Enzymes: No results for input(s): "CKTOTAL", "CKMB", "CKMBINDEX", "TROPONINI" in the last 168 hours. BNP: Invalid input(s): "POCBNP" CBG: Recent Labs  Lab 01/04/23 2243  GLUCAP 124*   D-Dimer No results for input(s): "DDIMER" in the last 72 hours. Hgb  A1c Recent Labs    01/04/23 2225  HGBA1C 5.8*   Lipid Profile Recent Labs    01/05/23 0510  CHOL 130  HDL 46  LDLCALC 55  TRIG 144  CHOLHDL 2.8   Thyroid function studies No results for input(s): "TSH", "T4TOTAL", "T3FREE", "THYROIDAB" in the last 72 hours.  Invalid input(s): "FREET3" Anemia work up No results for input(s): "VITAMINB12", "FOLATE", "FERRITIN", "TIBC", "IRON", "RETICCTPCT" in the last 72 hours. Urinalysis    Component Value Date/Time   COLORURINE Yellow 09/30/2013 0155   APPEARANCEUR Clear 12/28/2022 1108   LABSPEC 1.008 09/30/2013 0155   PHURINE 7.0 09/30/2013 0155   GLUCOSEU Negative 12/28/2022 1108   GLUCOSEU Negative 09/30/2013 0155   HGBUR Negative 09/30/2013 0155   BILIRUBINUR Negative 12/28/2022 1108   BILIRUBINUR Negative 09/30/2013 0155   KETONESUR Negative 09/30/2013 0155   PROTEINUR 2+ (A) 12/28/2022 1108   PROTEINUR Negative 09/30/2013 0155   UROBILINOGEN negative 03/26/2015 1451   NITRITE Negative 12/28/2022 1108   NITRITE Negative 09/30/2013 0155   LEUKOCYTESUR Negative 12/28/2022 1108   LEUKOCYTESUR Negative 09/30/2013 0155   Sepsis Labs Recent Labs  Lab 01/04/23 2225  WBC 7.8   Microbiology Recent Results (from the past 240 hour(s))  Microscopic Examination     Status: Abnormal   Collection Time: 12/28/22 11:08 AM   Urine  Result Value Ref Range Status   WBC, UA 0-5 0 - 5 /hpf Final   RBC, Urine 3-10 (A) 0 - 2 /hpf Final   Epithelial Cells (non renal) 0-10 0 - 10 /hpf Final   Bacteria, UA None seen None seen/Few Final     Time coordinating discharge: Over 30 minutes  SIGNED:   Tresa Moore, MD  [facility] 01/05/2023, 12:13 PM Pager   If 7PM-7AM, please contact night-coverage

## 2023-01-05 NOTE — Progress Notes (Signed)
*  PRELIMINARY RESULTS* Echocardiogram 2D Echocardiogram has been performed.  Hunter Oconnor 01/05/2023, 10:30 AM

## 2023-01-05 NOTE — TOC CM/SW Note (Signed)
Transition of Care [facility]) - Inpatient Brief Assessment   Patient Details  Name: Hunter Oconnor MRN: [id number] Date of Birth: [birth year]  [facility] of [facility] CM/SW Contact:    Margarito Liner, LCSW Phone Number: 01/05/2023, 10:32 AM   Clinical Narrative: Patient has orders to discharge home today. Chart reviewed. No TOC needs identified. CSW signing off.  Transition of Care Asessment: Insurance and Status: Insurance coverage has been reviewed Patient has primary care physician: Yes Home environment has been reviewed: Single family home Prior level of function:: Not documented Prior/Current Home Services: No current home services Social Determinants of Health Reivew: SDOH reviewed no interventions necessary Readmission risk has been reviewed: Yes Transition of care needs: no transition of care needs at this time

## 2023-01-05 NOTE — Plan of Care (Signed)

## 2023-01-05 NOTE — Progress Notes (Signed)
Patient has no signs of stroke, NIH negative. Vital signs stable. Wife at beside, belongings returned. IV removed, discharged to home.

## 2023-01-06 ENCOUNTER — Telehealth: Payer: Self-pay

## 2023-01-06 NOTE — Transitions of Care (Post Inpatient/ED Visit) (Signed)
   01/06/2023  Name: Hunter Oconnor MRN: [id number] DOB: 30-Oct-1950  Today's TOC FU Call Status: Today's TOC FU Call Status:: Successful TOC FU Call Completed TOC FU Call Complete Date: 01/06/23 Patient's Name and Date of Birth confirmed.  Transition Care Management Follow-up Telephone Call Date of Discharge: 01/05/23 Discharge Facility: [facility]) Type of Discharge: Emergency Department Reason for ED Visit: Other: (visual distrub) How have you been since you were released from the hospital?: Better Any questions or concerns?: No  Items Reviewed: Did you receive and understand the discharge instructions provided?: Yes Medications obtained,verified, and reconciled?: Yes (Medications Reviewed) Any new allergies since your discharge?: No Dietary orders reviewed?: No Do you have support at home?: Yes People in Home: spouse  Medications Reviewed Today: Medications Reviewed Today     Reviewed by Karena Addison, LPN (Licensed Practical Nurse) on 01/06/23 at 1040  Med List Status: <None>   Medication Order Taking? Sig Documenting Provider Last Dose Status Informant  aspirin EC 81 MG tablet [id number] No Take 81 mg by mouth at bedtime. [provider] Past Month Active Spouse/Significant Other  Fluticasone Furoate (ARNUITY ELLIPTA) 100 MCG/ACT AEPB [id number] No Inhale 100 mcg into the lungs daily.  Patient taking differently: Inhale 100 mcg into the lungs at bedtime.   Simmons-Robinson, Tawanna Cooler, MD 01/04/2023 pm Active Spouse/Significant Other  levothyroxine (SYNTHROID) 150 MCG tablet [id number] No TAKE 1 TABLET DAILY  Patient taking differently: Take 150 mcg by mouth at bedtime.   Malva Limes, MD 01/04/2023 pm Active Spouse/Significant Other  meloxicam (MOBIC) 15 MG tablet [id number] No TAKE 1 TABLET (15 MG TOTAL) BY MOUTH DAILY.  Patient taking differently: Take 15 mg by mouth at bedtime.   Bosie Clos, MD 01/04/2023 pm Active  Spouse/Significant Other  pravastatin (PRAVACHOL) 40 MG tablet [id number] No Take 1 tablet (40 mg total) by mouth daily.  Patient taking differently: Take 40 mg by mouth at bedtime.   Ronnald Ramp, MD 01/04/2023 pm Active Spouse/Significant Other  traZODone (DESYREL) 100 MG tablet [id number] No TAKE 1 TABLET AT BEDTIME Malva Limes, MD 01/04/2023 pm Active Spouse/Significant Other  Vibegron (GEMTESA) 75 MG TABS [id number] No Take 1 tablet (75 mg total) by mouth daily.  Patient taking differently: Take 75 mg by mouth at bedtime.   Riki Altes, MD 01/04/2023 pm Active Spouse/Significant Other            Home Care and Equipment/Supplies: Were Home Health Services Ordered?: NA Any new equipment or medical supplies ordered?: NA  Functional Questionnaire: Do you need assistance with bathing/showering or dressing?: No Do you need assistance with meal preparation?: No Do you need assistance with eating?: No Do you have difficulty maintaining continence: No Do you need assistance with getting out of bed/getting out of a chair/moving?: No Do you have difficulty managing or taking your medications?: No  Follow up appointments reviewed: PCP Follow-up appointment confirmed?: No (transferred to Dr Sullivan Lone) Specialist [facility] Follow-up appointment confirmed?: NA Do you need transportation to your follow-up appointment?: No Do you understand care options if your condition(s) worsen?: Yes-patient verbalized understanding    SIGNATURE Karena Addison, LPN [facility] Nurse Health Advisor Direct Dial [phone]

## 2023-01-08 DIAGNOSIS — F17210 Nicotine dependence, cigarettes, uncomplicated: Secondary | ICD-10-CM | POA: Diagnosis not present

## 2023-01-08 DIAGNOSIS — R001 Bradycardia, unspecified: Secondary | ICD-10-CM | POA: Diagnosis not present

## 2023-01-08 DIAGNOSIS — E7800 Pure hypercholesterolemia, unspecified: Secondary | ICD-10-CM | POA: Diagnosis not present

## 2023-01-08 DIAGNOSIS — G453 Amaurosis fugax: Secondary | ICD-10-CM | POA: Diagnosis not present

## 2023-01-13 DIAGNOSIS — R0609 Other forms of dyspnea: Secondary | ICD-10-CM | POA: Diagnosis not present

## 2023-01-13 DIAGNOSIS — I071 Rheumatic tricuspid insufficiency: Secondary | ICD-10-CM | POA: Diagnosis not present

## 2023-01-13 DIAGNOSIS — I2510 Atherosclerotic heart disease of native coronary artery without angina pectoris: Secondary | ICD-10-CM | POA: Diagnosis not present

## 2023-01-13 DIAGNOSIS — R001 Bradycardia, unspecified: Secondary | ICD-10-CM | POA: Diagnosis not present

## 2023-01-13 DIAGNOSIS — Z720 Tobacco use: Secondary | ICD-10-CM | POA: Diagnosis not present

## 2023-01-13 DIAGNOSIS — E7800 Pure hypercholesterolemia, unspecified: Secondary | ICD-10-CM | POA: Diagnosis not present

## 2023-01-13 DIAGNOSIS — I351 Nonrheumatic aortic (valve) insufficiency: Secondary | ICD-10-CM | POA: Diagnosis not present

## 2023-01-19 ENCOUNTER — Other Ambulatory Visit: Payer: Self-pay | Admitting: Family Medicine

## 2023-01-19 DIAGNOSIS — G453 Amaurosis fugax: Secondary | ICD-10-CM

## 2023-01-20 ENCOUNTER — Ambulatory Visit: Payer: Medicare Other | Admitting: Urology

## 2023-01-20 VITALS — BP 173/80 | HR 47

## 2023-01-20 DIAGNOSIS — R3129 Other microscopic hematuria: Secondary | ICD-10-CM | POA: Diagnosis not present

## 2023-01-20 DIAGNOSIS — N401 Enlarged prostate with lower urinary tract symptoms: Secondary | ICD-10-CM

## 2023-01-20 LAB — URINALYSIS, COMPLETE
Bilirubin, UA: NEGATIVE
Glucose, UA: NEGATIVE
Ketones, UA: NEGATIVE
Leukocytes,UA: NEGATIVE
Nitrite, UA: NEGATIVE
Specific Gravity, UA: 1.02 (ref 1.005–1.030)
Urobilinogen, Ur: 0.2 mg/dL (ref 0.2–1.0)
pH, UA: 6 (ref 5.0–7.5)

## 2023-01-20 LAB — MICROSCOPIC EXAMINATION: Bacteria, UA: NONE SEEN

## 2023-01-20 MED ORDER — GEMTESA 75 MG PO TABS: 75.0000 mg | ORAL_TABLET | Freq: Every day | ORAL | 1 refills | Status: AC

## 2023-01-20 NOTE — Progress Notes (Signed)
   01/20/23  CC:  Chief Complaint  Patient presents with   Cysto    HPI: 72 year old male with a personal history of microscopic hematuria and smoking who presents today for cystoscopy.  He was given with Dr. Lonna Cobb who unfortunately had to leave for an emergent procedure in the operating room.  Patient was taking samples of Myrbetriq and does report slight improvement in his urgency/frequency symptoms.  He underwent CT urogram which is reviewed today, no GU pathology appreciated.  Blood pressure (!) 173/80, pulse (!) 47. NED. A&Ox3.   No respiratory distress   Abd soft, NT, ND Normal phallus with bilateral descended testicles  Cystoscopy Procedure Note  Patient identification was confirmed, informed consent was obtained, and patient was prepped using Betadine solution.  Lidocaine jelly was administered per urethral meatus.     Pre-Procedure: - Inspection reveals a normal caliber ureteral meatus.  Procedure: The flexible cystoscope was introduced without difficulty - No urethral strictures/lesions are present. - Enlarged prostate relatively tight prostatic fossa -  Mildly elevated  bladder neck - Bilateral ureteral orifices identified - Bladder mucosa  reveals no ulcers, tumors, or lesions - No bladder stones - Very mild trabeculation  Retroflexion shows slight intravesical protrusion but no distinct median lobe   Post-Procedure: - Patient tolerated the procedure well  Assessment/ Plan:  1. Microhematuria Cystoscopy today was unremarkable other than for prostamegaly  CT urogram was also reassuring  2. Benign prostatic hyperplasia with lower urinary tract symptoms, symptom details unspecified (Primary) Will refill Gemtesa today if cost effective - Urinalysis, Complete    Follow-up with Dr. Lonna Cobb in about 1 month for IPSS/PVR  Vanna Scotland, MD

## 2023-01-22 ENCOUNTER — Ambulatory Visit
Admission: RE | Admit: 2023-01-22 | Discharge: 2023-01-22 | Disposition: A | Discharge status: Home / Self Care | Payer: Medicare Other | Source: Ambulatory Visit | Attending: Family Medicine | Admitting: Family Medicine

## 2023-01-22 DIAGNOSIS — G453 Amaurosis fugax: Secondary | ICD-10-CM

## 2023-01-22 DIAGNOSIS — I6523 Occlusion and stenosis of bilateral carotid arteries: Secondary | ICD-10-CM | POA: Diagnosis not present

## 2023-02-08 ENCOUNTER — Other Ambulatory Visit: Payer: Medicare Other

## 2023-02-24 ENCOUNTER — Ambulatory Visit (INDEPENDENT_AMBULATORY_CARE_PROVIDER_SITE_OTHER): Payer: Medicare Other | Admitting: Urology

## 2023-02-24 ENCOUNTER — Encounter: Payer: Self-pay | Admitting: Urology

## 2023-02-24 VITALS — BP 122/71 | Ht 68.0 in | Wt 138.0 lb

## 2023-02-24 DIAGNOSIS — N401 Enlarged prostate with lower urinary tract symptoms: Secondary | ICD-10-CM | POA: Diagnosis not present

## 2023-02-24 LAB — BLADDER SCAN AMB NON-IMAGING: Scan Result: 0

## 2023-02-24 MED ORDER — TAMSULOSIN HCL 0.4 MG PO CAPS: 0.4000 mg | ORAL_CAPSULE | Freq: Every day | ORAL | 1 refills | Status: DC

## 2023-02-24 NOTE — Progress Notes (Signed)
I, Hunter Oconnor, acting as a scribe for Hunter Altes, MD., have documented all relevant documentation on the behalf of Hunter Altes, MD, as directed by Hunter Altes, MD while in the presence of Hunter Altes, MD.  02/24/2023 12:03 PM   Hunter Oconnor 02-29-1950 [id number]  Referring provider: Bosie Clos, MD [address] [place],  [place] [address]  Chief Complaint  Patient presents with   Benign Prostatic Hypertrophy   Urologic history: 1. Lower urinary tract symptoms Gemtesa 75 mg daily  2. Microhematuria  HPI: Hunter Oconnor is a 73 y.o. male presents for 1 month follow-up  Initially seen 12/28/22 for bothersome lower urinary tract symptoms with IPSS 18/35.  He had tried tamsulosin without improvement in his symptoms.  His most bothersome symptoms were storage-related and he was given Gemtesa samples. He was incidentally noted to have microhematuria and a CT urogram showed no upper tract abnormalities.  I had an add-on OR case the day his cystoscopy was scheduled and Dr. Apolinar Oconnor performed the procedure which showed lateral lobe enlargement with a tight prostatic fossa. No intravascular median lobe. His Gemtesa was refilled and a 1 month follow-up was recommended.  He states after the cystoscopy, he noted significant improvement in his voiding pattern. He completed his last dose of Gemtesa today. IPSS today 13/35   PMH: Past Medical History:  Diagnosis Date   CAFL (chronic airflow limitation) (HCC) 07/10/2014   COPD (chronic obstructive pulmonary disease) (HCC)    Hernia of abdominal wall    Hyperlipidemia    Hypothyroid    Insomnia     Surgical History: Past Surgical History:  Procedure Laterality Date   APPENDECTOMY     BACK SURGERY     neck     CERVICAL DISC SURGERY  2008   Ruptured disc   COLONOSCOPY N/A 08/27/2020   Procedure: COLONOSCOPY;  Surgeon: Hunter Mood, MD;  Location: [facility] ENDOSCOPY;  Service: Gastroenterology;  Laterality:  N/A;   COLONOSCOPY WITH PROPOFOL N/A 12/11/2019   Procedure: COLONOSCOPY WITH PROPOFOL;  Surgeon: Hunter Mood, MD;  Location: [facility] ENDOSCOPY;  Service: Gastroenterology;  Laterality: N/A;   COLONOSCOPY WITH PROPOFOL N/A 04/15/2021   Procedure: COLONOSCOPY WITH PROPOFOL;  Surgeon: Hunter Minium, MD;  Location: [facility] ENDOSCOPY;  Service: Endoscopy;  Laterality: N/A;   HERNIA REPAIR     INGUINAL HERNIA REPAIR Right    SHOULDER SURGERY     XI ROBOTIC ASSISTED INGUINAL HERNIA REPAIR WITH MESH Left 11/10/2019   Procedure: XI ROBOTIC ASSISTED INGUINAL HERNIA REPAIR WITH MESH;  Surgeon: Hunter Lerner, MD;  Location: [facility] ORS;  Service: General;  Laterality: Left;    Home Medications:  Allergies as of 02/24/2023   No Known Allergies      Medication List        Accurate as of February 24, 2023 12:03 PM. If you have any questions, ask your nurse or doctor.          Arnuity Ellipta 100 MCG/ACT Aepb Generic drug: Fluticasone Furoate Inhale 100 mcg into the lungs daily. What changed: when to take this   aspirin EC 81 MG tablet Take 81 mg by mouth at bedtime.   Gemtesa 75 MG Tabs Generic drug: Vibegron Take 1 tablet (75 mg total) by mouth daily.   levothyroxine 150 MCG tablet Commonly known as: SYNTHROID TAKE 1 TABLET DAILY What changed: when to take this   meloxicam 15 MG tablet Commonly known as: MOBIC TAKE 1 TABLET (15  MG TOTAL) BY MOUTH DAILY. What changed: when to take this   pravastatin 40 MG tablet Commonly known as: PRAVACHOL Take 1 tablet (40 mg total) by mouth daily. What changed: when to take this   tamsulosin 0.4 MG Caps capsule Commonly known as: FLOMAX Take 1 capsule (0.4 mg total) by mouth daily. Started by: Hunter Oconnor   traZODone 100 MG tablet Commonly known as: DESYREL TAKE 1 TABLET AT BEDTIME        Allergies: No Known Allergies  Family History: Family History  Problem Relation Age of Onset   Cancer Mother        colon ca   Hypertension  Father    Cancer Father     Social History:  reports that he has been smoking cigarettes. He has a 53 pack-year smoking history. He has never used smokeless tobacco. He reports current alcohol use. He reports that he does not use drugs.   Physical Exam: BP 122/71   Ht 5\' 8"  (1.727 m)   Wt 138 lb (62.6 kg)   BMI 20.98 kg/m   Constitutional:  Alert and oriented, No acute distress. HEENT: Hunter Oconnor AT Respiratory: Normal respiratory effort, no increased work of breathing. Psychiatric: Normal Oconnor and affect.   Pertinent Imaging: CT was personally reviewed and interpreted.   CT HEMATURIA WORKUP  Narrative CLINICAL DATA:  Microhematuria.  BPH.  EXAM: CT ABDOMEN AND PELVIS WITHOUT AND WITH CONTRAST  TECHNIQUE: Multidetector CT imaging of the abdomen and pelvis was performed following the standard protocol before and following the bolus administration of intravenous contrast. Initial unenhanced images are obtained. Subsequent contrast-enhanced images are obtained with portal venous phase imaging in the supine position and delayed imaging in the prone position.  RADIATION DOSE REDUCTION: This exam was performed according to the departmental dose-optimization program which includes automated exposure control, adjustment of the mA and/or kV according to patient size and/or use of iterative reconstruction technique.  CONTRAST:  OMNIPAQUE IOHEXOL 300 MG/ML  SOLN  COMPARISON:  PET-CT 06/13/2020  FINDINGS: Lower chest: Lung bases are clear.  Hepatobiliary: No focal liver abnormality is seen. No gallstones, gallbladder wall thickening, or biliary dilatation.  Pancreas: Unremarkable. No pancreatic ductal dilatation or surrounding inflammatory changes.  Spleen: Normal in size without focal abnormality.  Adrenals/Urinary Tract: No adrenal gland nodules. Unenhanced images demonstrate renal vascular calcifications. No stones. Bilateral renal cysts. Largest on the left measures  about 4 cm diameter. No imaging follow-up is indicated. No hydronephrosis or hydroureter. Intrarenal collecting systems, ureters, and the bladder are unremarkable. No urinary tract mass or filling defect identified.  Stomach/Bowel: Stomach, small bowel, and colon are not abnormally distended. No wall thickening or inflammatory changes. Appendix is not identified.  Vascular/Lymphatic: Diffuse calcification of the abdominal aorta and iliac arteries. Bilateral common iliac artery aneurysms with right measuring 1.5 cm diameter and left 1.8 cm diameter. No significant lymphadenopathy.  Reproductive: Prostate gland is enlarged, measuring 5 cm diameter.  Other: No free air or free fluid in the abdomen. Postoperative right inguinal hernia repair.  Musculoskeletal: Spondylolysis with mild spondylolisthesis at L5-S1. Mild degenerative changes in the lumbar spine.  IMPRESSION: 1. No renal or ureteral stone or obstruction. No solid mass in the urinary tract. No abnormalities identified to account for hematuria. 2. 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 3. Prostate gland is enlarged.   Electronically Signed By: Burman Nieves M.D. On: 01/04/2023 23:11   Assessment & Plan:    1. BPH with LUTS PVR today 0  mL. His symptoms improved after cystoscopy appeared. Prostatic fossa was noted to be tight.  Will place back on tamsulosin to see if this effect can be maintained.  We also discussed outlet procedures including UroLift. He was provided literature, and will call back if interested.   I have reviewed the above documentation for accuracy and completeness, and I agree with the above.   Hunter Altes, MD  [facility] [address], [address] [place], [place] [address] [phone]

## 2023-03-18 ENCOUNTER — Other Ambulatory Visit: Payer: Self-pay | Admitting: Urology

## 2023-03-29 ENCOUNTER — Ambulatory Visit: Payer: Medicare Other

## 2023-05-07 ENCOUNTER — Ambulatory Visit: Attending: Family Medicine

## 2023-05-07 DIAGNOSIS — I714 Abdominal aortic aneurysm, without rupture, unspecified: Secondary | ICD-10-CM | POA: Insufficient documentation

## 2023-05-07 DIAGNOSIS — I7121 Aneurysm of the ascending aorta, without rupture: Secondary | ICD-10-CM

## 2023-06-21 ENCOUNTER — Other Ambulatory Visit: Payer: Self-pay | Admitting: *Deleted

## 2023-06-21 MED ORDER — TAMSULOSIN HCL 0.4 MG PO CAPS: 0.4000 mg | ORAL_CAPSULE | Freq: Every day | ORAL | 3 refills | Status: AC
